# Patient Record
Sex: Female | Born: 1985
Health system: Southern US, Community
[De-identification: ages and names within clinical notes are randomized; demographics above are authoritative.]

## PROBLEM LIST (undated history)

## (undated) DIAGNOSIS — R519 Headache, unspecified: Secondary | ICD-10-CM

## (undated) DIAGNOSIS — R51 Headache: Secondary | ICD-10-CM

## (undated) DIAGNOSIS — F419 Anxiety disorder, unspecified: Secondary | ICD-10-CM

## (undated) HISTORY — DX: Anxiety disorder, unspecified: F41.9

## (undated) HISTORY — PX: KNEE SURGERY: SHX244

## (undated) HISTORY — DX: Headache: R51

## (undated) HISTORY — DX: Headache, unspecified: R51.9

---

## 2017-09-21 ENCOUNTER — Other Ambulatory Visit: Payer: Self-pay | Admitting: Physician Assistant

## 2017-09-21 DIAGNOSIS — N63 Unspecified lump in unspecified breast: Secondary | ICD-10-CM

## 2017-09-28 ENCOUNTER — Ambulatory Visit
Admission: RE | Admit: 2017-09-28 | Discharge: 2017-09-28 | Disposition: A | Discharge status: Home / Self Care | Payer: BLUE CROSS/BLUE SHIELD | Source: Ambulatory Visit | Attending: Physician Assistant | Admitting: Physician Assistant

## 2017-09-28 ENCOUNTER — Other Ambulatory Visit: Payer: Self-pay | Admitting: Physician Assistant

## 2017-09-28 DIAGNOSIS — N6489 Other specified disorders of breast: Secondary | ICD-10-CM

## 2017-09-28 DIAGNOSIS — N63 Unspecified lump in unspecified breast: Secondary | ICD-10-CM

## 2018-02-24 ENCOUNTER — Other Ambulatory Visit: Payer: Self-pay

## 2018-02-24 ENCOUNTER — Encounter: Payer: Self-pay | Admitting: *Deleted

## 2018-02-24 ENCOUNTER — Encounter: Payer: Self-pay | Admitting: Advanced Practice Midwife

## 2018-02-24 ENCOUNTER — Ambulatory Visit (INDEPENDENT_AMBULATORY_CARE_PROVIDER_SITE_OTHER): Payer: 59 | Admitting: Advanced Practice Midwife

## 2018-02-24 VITALS — BP 109/73 | HR 108 | Temp 98.0°F | Ht 62.0 in | Wt 120.0 lb

## 2018-02-24 DIAGNOSIS — F40298 Other specified phobia: Secondary | ICD-10-CM

## 2018-02-24 DIAGNOSIS — N6011 Diffuse cystic mastopathy of right breast: Secondary | ICD-10-CM

## 2018-02-24 DIAGNOSIS — N6012 Diffuse cystic mastopathy of left breast: Secondary | ICD-10-CM

## 2018-02-24 DIAGNOSIS — Z3491 Encounter for supervision of normal pregnancy, unspecified, first trimester: Secondary | ICD-10-CM

## 2018-02-24 DIAGNOSIS — O219 Vomiting of pregnancy, unspecified: Secondary | ICD-10-CM

## 2018-02-24 DIAGNOSIS — O34219 Maternal care for unspecified type scar from previous cesarean delivery: Secondary | ICD-10-CM

## 2018-02-24 DIAGNOSIS — Z3490 Encounter for supervision of normal pregnancy, unspecified, unspecified trimester: Secondary | ICD-10-CM | POA: Insufficient documentation

## 2018-02-24 MED ORDER — ONDANSETRON HCL 4 MG PO TABS: 4.0000 mg | ORAL_TABLET | Freq: Three times a day (TID) | ORAL | 3 refills | Status: DC | PRN

## 2018-02-24 NOTE — Patient Instructions (Addendum)
First Trimester of Pregnancy The first trimester of pregnancy is from week 1 until the end of week 13 (months 1 through 3). A week after a sperm fertilizes an egg, the egg will implant on the wall of the uterus. This embryo will begin to develop into a baby. Genes from you and your partner will form the baby. The female genes will determine whether the baby will be a boy or a girl. At 6-8 weeks, the eyes and face will be formed, and the heartbeat can be seen on ultrasound. At the end of 12 weeks, all the baby's organs will be formed. Now that you are pregnant, you will want to do everything you can to have a healthy baby. Two of the most important things are to get good prenatal care and to follow your health care provider's instructions. Prenatal care is all the medical care you receive before the baby's birth. This care will help prevent, find, and treat any problems during the pregnancy and childbirth. Body changes during your first trimester Your body goes through many changes during pregnancy. The changes vary from woman to woman.  You may gain or lose a couple of pounds at first.  You may feel sick to your stomach (nauseous) and you may throw up (vomit). If the vomiting is uncontrollable, call your health care provider.  You may tire easily.  You may develop headaches that can be relieved by medicines. All medicines should be approved by your health care provider.  You may urinate more often. Painful urination may mean you have a bladder infection.  You may develop heartburn as a result of your pregnancy.  You may develop constipation because certain hormones are causing the muscles that push stool through your intestines to slow down.  You may develop hemorrhoids or swollen veins (varicose veins).  Your breasts may begin to grow larger and become tender. Your nipples may stick out more, and the tissue that surrounds them (areola) may become darker.  Your gums may bleed and may be  sensitive to brushing and flossing.  Dark spots or blotches (chloasma, mask of pregnancy) may develop on your face. This will likely fade after the baby is born.  Your menstrual periods will stop.  You may have a loss of appetite.  You may develop cravings for certain kinds of food.  You may have changes in your emotions from day to day, such as being excited to be pregnant or being concerned that something may go wrong with the pregnancy and baby.  You may have more vivid and strange dreams.  You may have changes in your hair. These can include thickening of your hair, rapid growth, and changes in texture. Some women also have hair loss during or after pregnancy, or hair that feels dry or thin. Your hair will most likely return to normal after your baby is born.  What to expect at prenatal visits During a routine prenatal visit:  You will be weighed to make sure you and the baby are growing normally.  Your blood pressure will be taken.  Your abdomen will be measured to track your baby's growth.  The fetal heartbeat will be listened to between weeks 10 and 14 of your pregnancy.  Test results from any previous visits will be discussed.  Your health care provider may ask you:  How you are feeling.  If you are feeling the baby move.  If you have had any abnormal symptoms, such as leaking fluid, bleeding, severe headaches,   or abdominal cramping.  If you are using any tobacco products, including cigarettes, chewing tobacco, and electronic cigarettes.  If you have any questions.  Other tests that may be performed during your first trimester include:  Blood tests to find your blood type and to check for the presence of any previous infections. The tests will also be used to check for low iron levels (anemia) and protein on red blood cells (Rh antibodies). Depending on your risk factors, or if you previously had diabetes during pregnancy, you may have tests to check for high blood  sugar that affects pregnant women (gestational diabetes).  Urine tests to check for infections, diabetes, or protein in the urine.  An ultrasound to confirm the proper growth and development of the baby.  Fetal screens for spinal cord problems (spina bifida) and Down syndrome.  HIV (human immunodeficiency virus) testing. Routine prenatal testing includes screening for HIV, unless you choose not to have this test.  You may need other tests to make sure you and the baby are doing well.  Follow these instructions at home: Medicines  Follow your health care provider's instructions regarding medicine use. Specific medicines may be either safe or unsafe to take during pregnancy.  Take a prenatal vitamin that contains at least 600 micrograms (mcg) of folic acid.  If you develop constipation, try taking a stool softener if your health care provider approves. Eating and drinking  Eat a balanced diet that includes fresh fruits and vegetables, whole grains, good sources of protein such as meat, eggs, or tofu, and low-fat dairy. Your health care provider will help you determine the amount of weight gain that is right for you.  Avoid raw meat and uncooked cheese. These carry germs that can cause birth defects in the baby.  Eating four or five small meals rather than three large meals a day may help relieve nausea and vomiting. If you start to feel nauseous, eating a few soda crackers can be helpful. Drinking liquids between meals, instead of during meals, also seems to help ease nausea and vomiting.  Limit foods that are high in fat and processed sugars, such as fried and sweet foods.  To prevent constipation: ? Eat foods that are high in fiber, such as fresh fruits and vegetables, whole grains, and beans. ? Drink enough fluid to keep your urine clear or pale yellow. Activity  Exercise only as directed by your health care provider. Most women can continue their usual exercise routine during  pregnancy. Try to exercise for 30 minutes at least 5 days a week. Exercising will help you: ? Control your weight. ? Stay in shape. ? Be prepared for labor and delivery.  Experiencing pain or cramping in the lower abdomen or lower back is a good sign that you should stop exercising. Check with your health care provider before continuing with normal exercises.  Try to avoid standing for long periods of time. Move your legs often if you must stand in one place for a long time.  Avoid heavy lifting.  Wear low-heeled shoes and practice good posture.  You may continue to have sex unless your health care provider tells you not to. Relieving pain and discomfort  Wear a good support bra to relieve breast tenderness.  Take warm sitz baths to soothe any pain or discomfort caused by hemorrhoids. Use hemorrhoid cream if your health care provider approves.  Rest with your legs elevated if you have leg cramps or low back pain.  If you develop   varicose veins in your legs, wear support hose. Elevate your feet for 15 minutes, 3-4 times a day. Limit salt in your diet. Prenatal care  Schedule your prenatal visits by the twelfth week of pregnancy. They are usually scheduled monthly at first, then more often in the last 2 months before delivery.  Write down your questions. Take them to your prenatal visits.  Keep all your prenatal visits as told by your health care provider. This is important. Safety  Wear your seat belt at all times when driving.  Make a list of emergency phone numbers, including numbers for family, friends, the hospital, and police and fire departments. General instructions  Ask your health care provider for a referral to a local prenatal education class. Begin classes no later than the beginning of month 6 of your pregnancy.  Ask for help if you have counseling or nutritional needs during pregnancy. Your health care provider can offer advice or refer you to specialists for help  with various needs.  Do not use hot tubs, steam rooms, or saunas.  Do not douche or use tampons or scented sanitary pads.  Do not cross your legs for long periods of time.  Avoid cat litter boxes and soil used by cats. These carry germs that can cause birth defects in the baby and possibly loss of the fetus by miscarriage or stillbirth.  Avoid all smoking, herbs, alcohol, and medicines not prescribed by your health care provider. Chemicals in these products affect the formation and growth of the baby.  Do not use any products that contain nicotine or tobacco, such as cigarettes and e-cigarettes. If you need help quitting, ask your health care provider. You may receive counseling support and other resources to help you quit.  Schedule a dentist appointment. At home, brush your teeth with a soft toothbrush and be gentle when you floss. Contact a health care provider if:  You have dizziness.  You have mild pelvic cramps, pelvic pressure, or nagging pain in the abdominal area.  You have persistent nausea, vomiting, or diarrhea.  You have a bad smelling vaginal discharge.  You have pain when you urinate.  You notice increased swelling in your face, hands, legs, or ankles.  You are exposed to fifth disease or chickenpox.  You are exposed to German measles (rubella) and have never had it. Get help right away if:  You have a fever.  You are leaking fluid from your vagina.  You have spotting or bleeding from your vagina.  You have severe abdominal cramping or pain.  You have rapid weight gain or loss.  You vomit blood or material that looks like coffee grounds.  You develop a severe headache.  You have shortness of breath.  You have any kind of trauma, such as from a fall or a car accident. Summary  The first trimester of pregnancy is from week 1 until the end of week 13 (months 1 through 3).  Your body goes through many changes during pregnancy. The changes vary from  woman to woman.  You will have routine prenatal visits. During those visits, your health care provider will examine you, discuss any test results you may have, and talk with you about how you are feeling. This information is not intended to replace advice given to you by your health care provider. Make sure you discuss any questions you have with your health care provider. Document Released: 10/05/2001 Document Revised: 09/22/2016 Document Reviewed: 09/22/2016 Elsevier Interactive Patient Education  2018 [employer]  

## 2018-02-24 NOTE — Progress Notes (Signed)
NEW OB

## 2018-02-24 NOTE — Progress Notes (Addendum)
PRENATAL VISIT NOTE  Subjective:  Shannon Delgado is a 32 y.o. G2P1001 at [redacted]w[redacted]d being seen today for initial prenatal visit.  She is currently monitored for the following issues for this low-risk pregnancy and has Supervision of normal pregnancy, antepartum; History of cesarean delivery, currently pregnant; and Fear of examinations on their problem list.  Patient reports nausea and vomiting.  Contractions: Not present. Vag. Bleeding: None.   . Denies leaking of fluid.   The following portions of the patient's history were reviewed and updated as appropriate: allergies, current medications, past family history, past medical history, past social history, past surgical history and problem list. Problem list updated.  Objective:   Vitals:   02/24/18 1042 02/24/18 1044  BP: 109/73   Pulse: (!) 108   Temp: 98 F (36.7 C)   Weight: 120 lb (54.4 kg)   Height:   (1.575 m)    Fetal Status: Fetal Heart Rate (bpm): 156         VS reviewed, nursing note reviewed,  Constitutional: well developed, well nourished, no distress HEENT: normocephalic CV: normal rate Pulm/chest wall: normal effort Breast Exam:  right breast normal with multiple small, smooth, soft, mobile masses throughout, no skin or nipple changes or axillary nodes, left breast normal with multiple small, smooth, soft, mobile masses throughout, no skin or nipple changes or axillary nodes Abdomen: soft Neuro: alert and oriented x 3 Skin: warm, dry Psych: affect normal Pelvic exam: Attempted but unsuccessful due to pt anxiety/pain  Assessment and Plan:  Pregnancy: G2P1001 at [redacted]w[redacted]d  1. Encounter for supervision of normal pregnancy, antepartum, unspecified gravidity  - Enroll Patient in Babyscripts - Obstetric Panel, Including HIV - Culture, OB Urine - Hemoglobinopathy evaluation - Cystic Fibrosis Mutation 97  2. History of cesarean delivery, currently pregnant --Discussed options for VBAC vs RLTCS, pt undecided but  likely prefers repeat C/S.  See note below regarding pt anxiety about exams.  3. Fear of examinations --Attempt to perform pelvic exam and Pap today unsuccessful, despite attempts at relaxation prior to and during exam.  Pt with hx of difficulty with exams and also some difficulty with intercourse intermittently. No evidence of generalized anxiety. Pt denies sexual abuse current or hx. --Discussed options of giving anxiety medication prior to exam, having someone drive her to and from appt, and trying again to obtain Pap during pregnancy or at [facility] visit.  4. Nausea and vomiting during pregnancy prior to [redacted] weeks gestation --Pt already taking B-6 and Unisom which does manage symptoms but she has missed a lot of work related to nausea.  Add Zofran for pt to use on work days. - pyridOXINE (VITAMIN B-6) 100 MG tablet; Take by mouth. - doxylamine, Sleep, (UNISOM) 25 MG tablet; Take by mouth. - ondansetron (ZOFRAN) 4 MG tablet; Take 1 tablet (4 mg total) by mouth every 8 (eight) hours as needed for nausea or vomiting.  Dispense: 20 tablet; Refill: 3  5. Fibrocystic breasts --Bilateral, hx of cysts, had mammogram 6 months ago for same, benign but repeat mammogram of right breast for f/u scheduled 03/30/18.   Preterm labor symptoms and general obstetric precautions including but not limited to vaginal bleeding, contractions, leaking of fluid and fetal movement were reviewed in detail with the patient. Please refer to After Visit Summary for other counseling recommendations.  Return in about 1 month (around 03/24/2018).  Future Appointments  Date Time Provider Department Center  03/30/2018  9:50 AM [facility]  Fatima Blank, CNM

## 2018-02-28 LAB — OBSTETRIC PANEL, INCLUDING HIV
ANTIBODY SCREEN: NEGATIVE
BASOS: 0 %
Basophils Absolute: 0 10*3/uL (ref 0.0–0.2)
EOS (ABSOLUTE): 0.1 10*3/uL (ref 0.0–0.4)
EOS: 1 %
HEMATOCRIT: 37.9 % (ref 34.0–46.6)
HEMOGLOBIN: 12 g/dL (ref 11.1–15.9)
HIV Screen 4th Generation wRfx: NONREACTIVE
Hepatitis B Surface Ag: NEGATIVE
IMMATURE GRANS (ABS): 0 10*3/uL (ref 0.0–0.1)
IMMATURE GRANULOCYTES: 0 %
LYMPHS: 9 %
Lymphocytes Absolute: 1.2 10*3/uL (ref 0.7–3.1)
MCH: 23.7 pg — AB (ref 26.6–33.0)
MCHC: 31.7 g/dL (ref 31.5–35.7)
MCV: 75 fL — AB (ref 79–97)
MONOS ABS: 0.5 10*3/uL (ref 0.1–0.9)
Monocytes: 3 %
Neutrophils Absolute: 12.1 10*3/uL — ABNORMAL HIGH (ref 1.4–7.0)
Neutrophils: 87 %
Platelets: 278 10*3/uL (ref 150–379)
RBC: 5.06 x10E6/uL (ref 3.77–5.28)
RDW: 17.1 % — AB (ref 12.3–15.4)
RPR Ser Ql: NONREACTIVE
RUBELLA: 2.16 {index} (ref >0.99)
Rh Factor: POSITIVE
WBC: 13.9 10*3/uL — ABNORMAL HIGH (ref 3.4–10.8)

## 2018-02-28 LAB — HEMOGLOBINOPATHY EVALUATION
HEMOGLOBIN F QUANTITATION: 1 % (ref 0.0–2.0)
HGB C: 0 %
HGB S: 0 %
HGB VARIANT: 0 %
Hemoglobin A2 Quantitation: 1.8 % (ref 1.8–3.2)
Hgb A: 97.2 % (ref 96.4–98.8)

## 2018-02-28 LAB — URINE CULTURE, OB REFLEX

## 2018-02-28 LAB — CULTURE, OB URINE

## 2018-03-01 ENCOUNTER — Telehealth: Payer: Self-pay

## 2018-03-01 ENCOUNTER — Other Ambulatory Visit: Payer: Self-pay | Admitting: Advanced Practice Midwife

## 2018-03-01 MED ORDER — AMOXICILLIN 500 MG PO CAPS: 500.0000 mg | ORAL_CAPSULE | Freq: Three times a day (TID) | ORAL | 0 refills | Status: DC

## 2018-03-01 NOTE — Telephone Encounter (Signed)
Returned call and answered pt questions about GBS

## 2018-03-01 NOTE — Telephone Encounter (Signed)
Contacted pt and advised of results and rx sent by provider. Pt requested that provider send rx for diclegis, advised that I would inform the provider.

## 2018-03-01 NOTE — Progress Notes (Signed)
Pt with positive urine culture for GBS at initial OB. Amoxicillin 500 mg TID sent to pt pharmacy. Message for [facility] staff to notify pt.

## 2018-03-02 LAB — CYSTIC FIBROSIS MUTATION 97: Interpretation: NOT DETECTED

## 2018-03-23 ENCOUNTER — Encounter: Payer: Self-pay | Admitting: Obstetrics and Gynecology

## 2018-03-23 ENCOUNTER — Ambulatory Visit (INDEPENDENT_AMBULATORY_CARE_PROVIDER_SITE_OTHER): Payer: 59 | Admitting: Obstetrics and Gynecology

## 2018-03-23 ENCOUNTER — Other Ambulatory Visit: Payer: Self-pay

## 2018-03-23 VITALS — BP 101/68 | HR 103 | Wt 131.7 lb

## 2018-03-23 DIAGNOSIS — Z3490 Encounter for supervision of normal pregnancy, unspecified, unspecified trimester: Secondary | ICD-10-CM

## 2018-03-23 DIAGNOSIS — O34219 Maternal care for unspecified type scar from previous cesarean delivery: Secondary | ICD-10-CM

## 2018-03-23 MED ORDER — VITAFOL-NANO 18-0.6-0.4 MG PO TABS: 1.0000 | ORAL_TABLET | Freq: Every day | ORAL | 11 refills | Status: DC

## 2018-03-23 NOTE — Progress Notes (Signed)
ROB. C/o NV. Wants  RX for Mini PNV.

## 2018-03-23 NOTE — Patient Instructions (Signed)

## 2018-03-23 NOTE — Progress Notes (Signed)
Subjective:  Shannon Delgado is a 32 y.o. G2P1001 at [redacted]w[redacted]d being seen today for ongoing prenatal care.  She is currently monitored for the following issues for this low-risk pregnancy and has Supervision of normal pregnancy, antepartum; History of cesarean delivery, currently pregnant; and Fear of examinations on their problem list.  Patient reports nausea.  Contractions: Not present. Vag. Bleeding: None.   . Denies leaking of fluid.   The following portions of the patient's history were reviewed and updated as appropriate: allergies, current medications, past family history, past medical history, past social history, past surgical history and problem list. Problem list updated.  Objective:   Vitals:   03/23/18 1515  BP: 101/68  Pulse: (!) 103  Weight: 131 lb 11.2 oz (59.7 kg)    Fetal Status: Fetal Heart Rate (bpm): 142         General:  Alert, oriented and cooperative. Patient is in no acute distress.  Skin: Skin is warm and dry. No rash noted.   Cardiovascular: Normal heart rate noted  Respiratory: Normal respiratory effort, no problems with respiration noted  Abdomen: Soft, gravid, appropriate for gestational age. Pain/Pressure: Absent     Pelvic:  Cervical exam deferred        Extremities: Normal range of motion.  Edema: None  Mental Status: Normal mood and affect. Normal behavior. Normal judgment and thought content.   Urinalysis:      Assessment and Plan:  Pregnancy: G2P1001 at [redacted]w[redacted]d  1. Encounter for supervision of normal pregnancy, antepartum, unspecified gravidity Stable Nausea improved with eating small frequent meals Wt gain reviewed Diet modifications discussed  - Prenatal-Fe Fum-Methf-FA w/o A (VITAFOL-NANO) 18-0.6-0.4 MG TABS; Take 1 tablet by mouth daily.  Dispense: 30 tablet; Refill: 11 - [place] [facility] OB COMP + 14 WK; Future  2. History of cesarean delivery, currently pregnant Will need to discuss at later appts  Preterm labor symptoms and general obstetric  precautions including but not limited to vaginal bleeding, contractions, leaking of fluid and fetal movement were reviewed in detail with the patient. Please refer to After Visit Summary for other counseling recommendations.  Return in about 1 month (around 04/20/2018) for OB visit.   Hermina Staggers, MD

## 2018-04-05 ENCOUNTER — Telehealth: Payer: Self-pay | Admitting: *Deleted

## 2018-04-05 NOTE — Telephone Encounter (Signed)
Shannon Delgado, [name], RN  [facility]        Received notice from pharmacy PNV is not covered by insurance     Attempt to contact pt re:Rx for PNV. No answer, VM full.  Pt may choose to purchase OTC prenatal until she gets Medicaid coverage.

## 2018-04-14 ENCOUNTER — Encounter (HOSPITAL_COMMUNITY): Payer: Self-pay

## 2018-04-19 ENCOUNTER — Encounter: Payer: Self-pay | Admitting: Obstetrics and Gynecology

## 2018-04-19 ENCOUNTER — Ambulatory Visit (INDEPENDENT_AMBULATORY_CARE_PROVIDER_SITE_OTHER): Payer: 59 | Admitting: Obstetrics and Gynecology

## 2018-04-19 VITALS — BP 110/60 | HR 95 | Wt 131.0 lb

## 2018-04-19 DIAGNOSIS — O34219 Maternal care for unspecified type scar from previous cesarean delivery: Secondary | ICD-10-CM

## 2018-04-19 DIAGNOSIS — Z3490 Encounter for supervision of normal pregnancy, unspecified, unspecified trimester: Secondary | ICD-10-CM

## 2018-04-19 NOTE — Patient Instructions (Signed)

## 2018-04-19 NOTE — Progress Notes (Signed)
U/S Scheduled on 04/24/2018  Pt has concerns about how she is measuring if (S>D)

## 2018-04-19 NOTE — Progress Notes (Signed)
Subjective:  Shannon Delgado is a 32 y.o. G2P1001 at 4052w1d being seen today for ongoing prenatal care.  She is currently monitored for the following issues for this low-risk pregnancy and has Supervision of normal pregnancy, antepartum; History of cesarean delivery, currently pregnant; and Fear of examinations on their problem list.  Patient reports no complaints.  Contractions: Not present. Vag. Bleeding: None.  Movement: Present. Denies leaking of fluid.   The following portions of the patient's history were reviewed and updated as appropriate: allergies, current medications, past family history, past medical history, past social history, past surgical history and problem list. Problem list updated.  Objective:   Vitals:   04/19/18 1428  BP: 110/60  Pulse: 95  Weight: 131 lb (59.4 kg)    Fetal Status: Fetal Heart Rate (bpm): 138   Movement: Present     General:  Alert, oriented and cooperative. Patient is in no acute distress.  Skin: Skin is warm and dry. No rash noted.   Cardiovascular: Normal heart rate noted  Respiratory: Normal respiratory effort, no problems with respiration noted  Abdomen: Soft, gravid, appropriate for gestational age. Pain/Pressure: Absent     Pelvic:  Cervical exam deferred        Extremities: Normal range of motion.  Edema: None  Mental Status: Normal mood and affect. Normal behavior. Normal judgment and thought content.   Urinalysis:      Assessment and Plan:  Pregnancy: G2P1001 at 1952w1d  1. Encounter for supervision of normal pregnancy, antepartum, unspecified gravidity Stable Anatomy scan next week Still holding on NIPS  2. History of cesarean delivery, currently pregnant Discuss at 28-30 week appt  Preterm labor symptoms and general obstetric precautions including but not limited to vaginal bleeding, contractions, leaking of fluid and fetal movement were reviewed in detail with the patient. Please refer to After Visit Summary for other  counseling recommendations.  Return in about 1 month (around 05/17/2018) for OB visit.   Hermina StaggersErvin, Darriana Deboy L, MD

## 2018-04-24 ENCOUNTER — Ambulatory Visit (HOSPITAL_COMMUNITY)
Admission: RE | Admit: 2018-04-24 | Discharge: 2018-04-24 | Disposition: A | Discharge status: Home / Self Care | Payer: 59 | Source: Ambulatory Visit | Attending: Obstetrics and Gynecology | Admitting: Obstetrics and Gynecology

## 2018-04-24 ENCOUNTER — Other Ambulatory Visit: Payer: Self-pay | Admitting: Obstetrics and Gynecology

## 2018-04-24 DIAGNOSIS — Z3492 Encounter for supervision of normal pregnancy, unspecified, second trimester: Secondary | ICD-10-CM | POA: Insufficient documentation

## 2018-04-24 DIAGNOSIS — Z3490 Encounter for supervision of normal pregnancy, unspecified, unspecified trimester: Secondary | ICD-10-CM

## 2018-04-24 DIAGNOSIS — Z3A19 19 weeks gestation of pregnancy: Secondary | ICD-10-CM | POA: Diagnosis not present

## 2018-04-24 DIAGNOSIS — Z363 Encounter for antenatal screening for malformations: Secondary | ICD-10-CM

## 2018-04-24 DIAGNOSIS — Z3482 Encounter for supervision of other normal pregnancy, second trimester: Secondary | ICD-10-CM | POA: Diagnosis not present

## 2018-04-25 ENCOUNTER — Other Ambulatory Visit (HOSPITAL_COMMUNITY): Payer: Self-pay | Admitting: *Deleted

## 2018-04-25 DIAGNOSIS — O26872 Cervical shortening, second trimester: Secondary | ICD-10-CM

## 2018-05-08 ENCOUNTER — Ambulatory Visit (HOSPITAL_COMMUNITY)
Admission: RE | Admit: 2018-05-08 | Discharge: 2018-05-08 | Disposition: A | Discharge status: Home / Self Care | Payer: Managed Care, Other (non HMO) | Source: Ambulatory Visit | Attending: Obstetrics and Gynecology | Admitting: Obstetrics and Gynecology

## 2018-05-08 ENCOUNTER — Other Ambulatory Visit (HOSPITAL_COMMUNITY): Payer: Self-pay | Admitting: Obstetrics and Gynecology

## 2018-05-08 ENCOUNTER — Encounter (HOSPITAL_COMMUNITY): Payer: Self-pay

## 2018-05-08 ENCOUNTER — Other Ambulatory Visit (HOSPITAL_COMMUNITY): Payer: 59

## 2018-05-08 DIAGNOSIS — Z3A22 22 weeks gestation of pregnancy: Secondary | ICD-10-CM | POA: Diagnosis not present

## 2018-05-08 DIAGNOSIS — O34219 Maternal care for unspecified type scar from previous cesarean delivery: Secondary | ICD-10-CM

## 2018-05-08 DIAGNOSIS — O26872 Cervical shortening, second trimester: Secondary | ICD-10-CM

## 2018-05-08 DIAGNOSIS — Z3686 Encounter for antenatal screening for cervical length: Secondary | ICD-10-CM | POA: Diagnosis not present

## 2018-05-17 ENCOUNTER — Ambulatory Visit (INDEPENDENT_AMBULATORY_CARE_PROVIDER_SITE_OTHER): Payer: Managed Care, Other (non HMO) | Admitting: Obstetrics & Gynecology

## 2018-05-17 VITALS — BP 105/63 | HR 103 | Wt 137.5 lb

## 2018-05-17 DIAGNOSIS — Z3490 Encounter for supervision of normal pregnancy, unspecified, unspecified trimester: Secondary | ICD-10-CM

## 2018-05-17 DIAGNOSIS — O34219 Maternal care for unspecified type scar from previous cesarean delivery: Secondary | ICD-10-CM

## 2018-05-17 NOTE — Patient Instructions (Addendum)
Second Trimester of Pregnancy The second trimester is from week 13 through week 28, month 4 through 6. This is often the time in pregnancy that you feel your best. Often times, morning sickness has lessened or quit. You may have more energy, and you may get hungry more often. Your unborn baby (fetus) is growing rapidly. At the end of the sixth month, he or she is about 9 inches long and weighs about 1 pounds. You will likely feel the baby move (quickening) between 18 and 20 weeks of pregnancy. Follow these instructions at home:  Avoid all smoking, herbs, and alcohol. Avoid drugs not approved by your doctor.  Do not use any tobacco products, including cigarettes, chewing tobacco, and electronic cigarettes. If you need help quitting, ask your doctor. You may get counseling or other support to help you quit.  Only take medicine as told by your doctor. Some medicines are safe and some are not during pregnancy.  Exercise only as told by your doctor. Stop exercising if you start having cramps.  Eat regular, healthy meals.  Wear a good support bra if your breasts are tender.  Do not use hot tubs, steam rooms, or saunas.  Wear your seat belt when driving.  Avoid raw meat, uncooked cheese, and liter boxes and soil used by cats.  Take your prenatal vitamins.  Take 1500-2000 milligrams of calcium daily starting at the 20th week of pregnancy until you deliver your baby.  Try taking medicine that helps you poop (stool softener) as needed, and if your doctor approves. Eat more fiber by eating fresh fruit, vegetables, and whole grains. Drink enough fluids to keep your pee (urine) clear or pale yellow.  Take warm water baths (sitz baths) to soothe pain or discomfort caused by hemorrhoids. Use hemorrhoid cream if your doctor approves.  If you have puffy, bulging veins (varicose veins), wear support hose. Raise (elevate) your feet for 15 minutes, 3-4 times a day. Limit salt in your diet.  Avoid heavy  lifting, wear low heals, and sit up straight.  Rest with your legs raised if you have leg cramps or low back pain.  Visit your dentist if you have not gone during your pregnancy. Use a soft toothbrush to brush your teeth. Be gentle when you floss.  You can have sex (intercourse) unless your doctor tells you not to.  Go to your doctor visits. Get help if:  You feel dizzy.  You have mild cramps or pressure in your lower belly (abdomen).  You have a nagging pain in your belly area.  You continue to feel sick to your stomach (nauseous), throw up (vomit), or have watery poop (diarrhea).  You have bad smelling fluid coming from your vagina.  You have pain with peeing (urination). Get help right away if:  You have a fever.  You are leaking fluid from your vagina.  You have spotting or bleeding from your vagina.  You have severe belly cramping or pain.  You lose or gain weight rapidly.  You have trouble catching your breath and have chest pain.  You notice sudden or extreme puffiness (swelling) of your face, hands, ankles, feet, or legs.  You have not felt the baby move in over an hour.  You have severe headaches that do not go away with medicine.  You have vision changes. This information is not intended to replace advice given to you by your health care provider. Make sure you discuss any questions you have with your health care   provider. Document Released: 01/05/2010 Document Revised: 03/18/2016 Document Reviewed: 12/12/2012 Elsevier Interactive Patient Education  2017 [place] B Streptococcus Infection During Pregnancy Group B Streptococcus (GBS) is a type of bacteria (Streptococcus agalactiae) that is often found in healthy people, commonly in the rectum, vagina, and intestines. In people who are healthy and not pregnant, the bacteria rarely cause serious illness or complications. However, women who test positive for GBS during pregnancy can pass the bacteria  to their baby during childbirth, which can cause serious infection in the baby after birth. Women with GBS may also have infections during their pregnancy or immediately after childbirth, such as such as urinary tract infections (UTIs) or infections of the uterus (uterine infections). Having GBS also increases a woman's risk of complications during pregnancy, such as early (preterm) labor or delivery, miscarriage, or stillbirth. Routine testing (screening) for GBS is recommended for all pregnant women. What increases the risk? You may have a higher risk for GBS infection during pregnancy if you had one during a past pregnancy. What are the signs or symptoms? In most cases, GBS infection does not cause symptoms in pregnant women. Signs and symptoms of a possible GBS-related infection may include:  Labor starting before the 37th week of pregnancy.  A UTI or bladder infection, which may cause: ? Fever. ? Pain or burning during urination. ? Frequent urination.  Fever during labor, along with: ? Bad-smelling discharge. ? Uterine tenderness. ? Rapid heartbeat in the mother, baby, or both.  Rare but serious symptoms of a possible GBS-related infection in women include:  Blood infection (septicemia). This may cause fever, chills, or confusion.  Lung infection (pneumonia). This may cause fever, chills, cough, rapid breathing, difficulty breathing, or chest pain.  Bone, joint, skin, or soft tissue infection.  How is this diagnosed? You may be screened for GBS between week 35 and week 37 of your pregnancy. If you have symptoms of preterm labor, you may be screened earlier. This condition is diagnosed based on lab test results from:  A swab of fluid from the vagina and rectum.  A urine sample.  How is this treated? This condition is treated with antibiotic medicine. When you go into labor, or as soon as your water breaks (your membranes rupture), you will be given antibiotics through an IV  tube. Antibiotics will continue until after you give birth. If you are having a cesarean delivery, you do not need antibiotics unless your membranes have already ruptured. Follow these instructions at home:  Take over-the-counter and prescription medicines only as told by your health care provider.  Take your antibiotic medicine as told by your health care provider. Do not stop taking the antibiotic even if you start to feel better.  Keep all pre-birth (prenatal) visits and follow-up visits as told by your health care provider. This is important. Contact a health care provider if:  You have pain or burning when you urinate.  You have to urinate frequently.  You have a fever or chills.  You develop a bad-smelling vaginal discharge. Get help right away if:  Your membranes rupture.  You go into labor.  You have severe pain in your abdomen.  You have difficulty breathing.  You have chest pain. This information is not intended to replace advice given to you by your health care provider. Make sure you discuss any questions you have with your health care provider. Document Released: 01/18/2008 Document Revised: 05/07/2016 Document Reviewed: 05/06/2016 Elsevier Interactive Patient Education  2018 [employer]  Inc[employer]  

## 2018-05-17 NOTE — Progress Notes (Signed)
   PRENATAL VISIT NOTE  Subjective:  Shannon Delgado is a 32 y.o. G2P1001 at 6043w1d being seen today for ongoing prenatal care.  She is currently monitored for the following issues for this low-risk pregnancy and has Supervision of normal pregnancy, antepartum; History of cesarean delivery, currently pregnant; and Fear of examinations on their problem list.  Patient reports no complaints.  Contractions: Not present. Vag. Bleeding: None.  Movement: Present. Denies leaking of fluid.   The following portions of the patient's history were reviewed and updated as appropriate: allergies, current medications, past family history, past medical history, past social history, past surgical history and problem list. Problem list updated.  Objective:   Vitals:   05/17/18 1325  BP: 105/63  Pulse: (!) 103  Weight: 137 lb 8 oz (62.4 kg)    Fetal Status: Fetal Heart Rate (bpm): 146   Movement: Present     General:  Alert, oriented and cooperative. Patient is in no acute distress.  Skin: Skin is warm and dry. No rash noted.   Cardiovascular: Normal heart rate noted  Respiratory: Normal respiratory effort, no problems with respiration noted  Abdomen: Soft, gravid, appropriate for gestational age.  Pain/Pressure: Present     Pelvic: Cervical exam deferred        Extremities: Normal range of motion.  Edema: None  Mental Status: Normal mood and affect. Normal behavior. Normal judgment and thought content.   Assessment and Plan:  Pregnancy: G2P1001 at 6043w1d  1. Encounter for supervision of normal pregnancy, antepartum, unspecified gravidity Routine f/u  2. History of cesarean delivery, currently pregnant Discussed RCS vs TOLAC. She [place] in [place]  Preterm labor symptoms and general obstetric precautions including but not limited to vaginal bleeding, contractions, leaking of fluid and fetal movement were reviewed in detail with the patient. Please refer to After Visit Summary for  other counseling recommendations.  Return in about 1 month (around 06/14/2018) for 2 hr gtt.  No future appointments.  Scheryl DarterJames Donnalyn Juran, MD

## 2018-06-16 ENCOUNTER — Encounter: Payer: Managed Care, Other (non HMO) | Admitting: Obstetrics

## 2018-06-16 ENCOUNTER — Other Ambulatory Visit: Payer: Managed Care, Other (non HMO)

## 2018-06-23 ENCOUNTER — Ambulatory Visit (INDEPENDENT_AMBULATORY_CARE_PROVIDER_SITE_OTHER): Payer: Managed Care, Other (non HMO) | Admitting: Obstetrics

## 2018-06-23 ENCOUNTER — Encounter: Payer: Self-pay | Admitting: Obstetrics

## 2018-06-23 ENCOUNTER — Other Ambulatory Visit: Payer: Managed Care, Other (non HMO)

## 2018-06-23 ENCOUNTER — Encounter: Payer: Managed Care, Other (non HMO) | Admitting: Obstetrics

## 2018-06-23 VITALS — BP 112/62 | HR 114 | Wt 141.6 lb

## 2018-06-23 DIAGNOSIS — O2340 Unspecified infection of urinary tract in pregnancy, unspecified trimester: Secondary | ICD-10-CM

## 2018-06-23 DIAGNOSIS — O34219 Maternal care for unspecified type scar from previous cesarean delivery: Secondary | ICD-10-CM

## 2018-06-23 DIAGNOSIS — Z3490 Encounter for supervision of normal pregnancy, unspecified, unspecified trimester: Secondary | ICD-10-CM

## 2018-06-23 DIAGNOSIS — B955 Unspecified streptococcus as the cause of diseases classified elsewhere: Secondary | ICD-10-CM

## 2018-06-23 NOTE — Progress Notes (Signed)
Patient reports good fetal movement, denies pain. 

## 2018-06-23 NOTE — Progress Notes (Signed)
Subjective:  Shannon Delgado is a 32 y.o. G2P1001 at 161w3d being seen today for ongoing prenatal care.  She is currently monitored for the following issues for this low-risk pregnancy and has Supervision of normal pregnancy, antepartum; History of cesarean delivery, currently pregnant; and Fear of examinations on their problem list.  Patient reports no complaints.  Contractions: Not present. Vag. Bleeding: None.  Movement: Present. Denies leaking of fluid.   The following portions of the patient's history were reviewed and updated as appropriate: allergies, current medications, past family history, past medical history, past social history, past surgical history and problem list. Problem list updated.  Objective:   Vitals:   06/23/18 0908  BP: 112/62  Pulse: (!) 114  Weight: 141 lb 9.6 oz (64.2 kg)    Fetal Status: Fetal Heart Rate (bpm): 140   Movement: Present     General:  Alert, oriented and cooperative. Patient is in no acute distress.  Skin: Skin is warm and dry. No rash noted.   Cardiovascular: Normal heart rate noted  Respiratory: Normal respiratory effort, no problems with respiration noted  Abdomen: Soft, gravid, appropriate for gestational age. Pain/Pressure: Absent     Pelvic:  Cervical exam deferred        Extremities: Normal range of motion.  Edema: None  Mental Status: Normal mood and affect. Normal behavior. Normal judgment and thought content.   Urinalysis:      Assessment and Plan:  Pregnancy: G2P1001 at 591w3d  1. Encounter for supervision of normal pregnancy, antepartum, unspecified gravidity Rx: - Glucose Tolerance, 2 Hours w/1 Hour - HIV antibody (with reflex) - RPR - CBC  Preterm labor symptoms and general obstetric precautions including but not limited to vaginal bleeding, contractions, leaking of fluid and fetal movement were reviewed in detail with the patient. Please refer to After Visit Summary for other counseling recommendations.  Return in about 2  weeks (around 07/07/2018) for ROB.   Brock BadHarper, Anaisabel Pederson A, MD

## 2018-06-24 ENCOUNTER — Other Ambulatory Visit: Payer: Self-pay | Admitting: Obstetrics

## 2018-06-24 DIAGNOSIS — D508 Other iron deficiency anemias: Secondary | ICD-10-CM

## 2018-06-24 LAB — GLUCOSE TOLERANCE, 2 HOURS W/ 1HR
GLUCOSE, 2 HOUR: 115 mg/dL (ref 65–152)
GLUCOSE, FASTING: 82 mg/dL (ref 65–91)
Glucose, 1 hour: 148 mg/dL (ref 65–179)

## 2018-06-24 LAB — HIV ANTIBODY (ROUTINE TESTING W REFLEX): HIV SCREEN 4TH GENERATION: NONREACTIVE

## 2018-06-24 LAB — CBC
Hematocrit: 31.3 % — ABNORMAL LOW (ref 34.0–46.6)
Hemoglobin: 9.6 g/dL — ABNORMAL LOW (ref 11.1–15.9)
MCH: 22.9 pg — AB (ref 26.6–33.0)
MCHC: 30.7 g/dL — AB (ref 31.5–35.7)
MCV: 75 fL — ABNORMAL LOW (ref 79–97)
PLATELETS: 252 10*3/uL (ref 150–450)
RBC: 4.2 x10E6/uL (ref 3.77–5.28)
RDW: 14.3 % (ref 12.3–15.4)
WBC: 13.4 10*3/uL — ABNORMAL HIGH (ref 3.4–10.8)

## 2018-06-24 LAB — RPR: RPR Ser Ql: NONREACTIVE

## 2018-06-24 MED ORDER — FERROUS SULFATE 325 (65 FE) MG PO TABS: 325.0000 mg | ORAL_TABLET | Freq: Two times a day (BID) | ORAL | 5 refills | Status: DC

## 2018-07-07 ENCOUNTER — Ambulatory Visit (INDEPENDENT_AMBULATORY_CARE_PROVIDER_SITE_OTHER): Payer: Managed Care, Other (non HMO) | Admitting: Obstetrics

## 2018-07-07 ENCOUNTER — Encounter: Payer: Self-pay | Admitting: Obstetrics

## 2018-07-07 VITALS — BP 100/66 | HR 103 | Wt 144.0 lb

## 2018-07-07 DIAGNOSIS — O34219 Maternal care for unspecified type scar from previous cesarean delivery: Secondary | ICD-10-CM

## 2018-07-07 DIAGNOSIS — Z3493 Encounter for supervision of normal pregnancy, unspecified, third trimester: Secondary | ICD-10-CM

## 2018-07-07 DIAGNOSIS — Z3490 Encounter for supervision of normal pregnancy, unspecified, unspecified trimester: Secondary | ICD-10-CM

## 2018-07-07 NOTE — Progress Notes (Signed)
Subjective:  Shannon Delgado is a 32 y.o. G2P1001 at 9579w3d being seen today for ongoing prenatal care.  She is currently monitored for the following issues for this low-risk pregnancy and has Supervision of normal pregnancy, antepartum; History of cesarean delivery, currently pregnant; and Fear of examinations on their problem list.  Patient reports no complaints.  Contractions: Not present. Vag. Bleeding: None.  Movement: Present. Denies leaking of fluid.   The following portions of the patient's history were reviewed and updated as appropriate: allergies, current medications, past family history, past medical history, past social history, past surgical history and problem list. Problem list updated.  Objective:   Vitals:   07/07/18 0934  BP: 100/66  Pulse: (!) 103  Weight: 144 lb (65.3 kg)    Fetal Status:     Movement: Present     General:  Alert, oriented and cooperative. Patient is in no acute distress.  Skin: Skin is warm and dry. No rash noted.   Cardiovascular: Normal heart rate noted  Respiratory: Normal respiratory effort, no problems with respiration noted  Abdomen: Soft, gravid, appropriate for gestational age. Pain/Pressure: Absent     Pelvic:  Cervical exam deferred        Extremities: Normal range of motion.  Edema: None  Mental Status: Normal mood and affect. Normal behavior. Normal judgment and thought content.   Urinalysis:      Assessment and Plan:  Pregnancy: G2P1001 at 3379w3d  1. Encounter for supervision of normal pregnancy, antepartum, unspecified gravidity  2. History of cesarean delivery, currently pregnant   Preterm labor symptoms and general obstetric precautions including but not limited to vaginal bleeding, contractions, leaking of fluid and fetal movement were reviewed in detail with the patient. Please refer to After Visit Summary for other counseling recommendations.  Return in about 2 weeks (around 07/21/2018) for ROB.   Brock BadHarper, Charles A, MD

## 2018-07-21 ENCOUNTER — Ambulatory Visit (INDEPENDENT_AMBULATORY_CARE_PROVIDER_SITE_OTHER): Payer: Managed Care, Other (non HMO) | Admitting: Obstetrics & Gynecology

## 2018-07-21 VITALS — BP 96/59 | HR 112 | Wt 147.0 lb

## 2018-07-21 DIAGNOSIS — Z23 Encounter for immunization: Secondary | ICD-10-CM | POA: Diagnosis not present

## 2018-07-21 DIAGNOSIS — O34219 Maternal care for unspecified type scar from previous cesarean delivery: Secondary | ICD-10-CM

## 2018-07-21 DIAGNOSIS — Z3480 Encounter for supervision of other normal pregnancy, unspecified trimester: Secondary | ICD-10-CM

## 2018-08-07 ENCOUNTER — Encounter (HOSPITAL_COMMUNITY): Payer: Self-pay

## 2018-08-07 ENCOUNTER — Encounter: Payer: Self-pay | Admitting: Obstetrics & Gynecology

## 2018-08-07 ENCOUNTER — Ambulatory Visit (INDEPENDENT_AMBULATORY_CARE_PROVIDER_SITE_OTHER): Payer: Managed Care, Other (non HMO) | Admitting: Obstetrics & Gynecology

## 2018-08-07 VITALS — BP 114/69 | HR 120 | Wt 150.0 lb

## 2018-08-07 DIAGNOSIS — L299 Pruritus, unspecified: Secondary | ICD-10-CM

## 2018-08-07 DIAGNOSIS — O99713 Diseases of the skin and subcutaneous tissue complicating pregnancy, third trimester: Secondary | ICD-10-CM

## 2018-08-07 DIAGNOSIS — Z3480 Encounter for supervision of other normal pregnancy, unspecified trimester: Secondary | ICD-10-CM

## 2018-08-07 DIAGNOSIS — O34219 Maternal care for unspecified type scar from previous cesarean delivery: Secondary | ICD-10-CM

## 2018-08-07 MED ORDER — HYDROXYZINE HCL 25 MG PO TABS
25.0000 mg | ORAL_TABLET | Freq: Four times a day (QID) | ORAL | 2 refills | Status: DC | PRN
Start: 2018-08-07 — End: 2018-08-28

## 2018-08-07 NOTE — Progress Notes (Signed)
   PRENATAL VISIT NOTE  Subjective:  Shannon Delgado is a 32 y.o. G2P1001 at [redacted]w[redacted]d being seen today for ongoing prenatal care.  She is currently monitored for the following issues for this low-risk pregnancy and has Supervision of normal pregnancy, antepartum; History of cesarean delivery, currently pregnant; and Fear of examinations on their problem list.  Patient reports diffuse itching all over her body especially at night.  Nobody else has same symptoms. Recently switched detergents to free and clear preparation, unsure it this caused it. Only happens when she goes to bed. No rash.   Contractions: Irregular. Vag. Bleeding: None.  Movement: Present. Denies leaking of fluid.   The following portions of the patient's history were reviewed and updated as appropriate: allergies, current medications, past family history, past medical history, past social history, past surgical history and problem list. Problem list updated.  Objective:   Vitals:   08/07/18 1012  BP: 114/69  Pulse: (!) 120  Weight: 150 lb (68 kg)    Fetal Status: Fetal Heart Rate (bpm): 147 Fundal Height: 35 cm Movement: Present     General:  Alert, oriented and cooperative. Patient is in no acute distress.  Skin: Skin is warm and dry. No rash noted.   Cardiovascular: Normal heart rate noted  Respiratory: Normal respiratory effort, no problems with respiration noted  Abdomen: Soft, gravid, appropriate for gestational age.  Pain/Pressure: Present     Pelvic: Cervical exam deferred        Extremities: Normal range of motion.  Edema: None  Mental Status: Normal mood and affect. Normal behavior. Normal judgment and thought content.   Assessment and Plan:  Pregnancy: G2P1001 at [redacted]w[redacted]d  1. Pruritus of pregnancy in third trimester Unclear etiology but will evaluate for ICP, ?contact dermatitis.  Atarax prescribed as needed. Will follow up results and manage accordingly. - Comprehensive metabolic panel - Bile acids, total -  hydrOXYzine (ATARAX/VISTARIL) 25 MG tablet; Take 1-2 tablets (25-50 mg total) by mouth every 6 (six) hours as needed for itching.  Dispense: 30 tablet; Refill: 2  2. History of cesarean delivery, currently pregnant Desires repeat cesarean delivery, order sent for it to be scheduled at 39 weeks. Patient understands this may have to be done earlier if she has cholestasis or other concerning maternal-fetal indications.   3. Supervision of other normal pregnancy, antepartum Preterm labor symptoms and general obstetric precautions including but not limited to vaginal bleeding, contractions, leaking of fluid and fetal movement were reviewed in detail with the patient. Please refer to After Visit Summary for other counseling recommendations.  Return in about 1 week (around 08/14/2018) for OB Visit.  No future appointments.  Jaynie Collins, MD

## 2018-08-07 NOTE — Patient Instructions (Signed)
Return to clinic for any scheduled appointments or obstetric concerns, or go to [facility] for evaluation    Cholestasis of Pregnancy Cholestasis refers to any condition that causes the flow of digestive fluid (bile) produced by the liver to slow down or stop. Cholestasis of pregnancy is most common toward the end of pregnancy (thirdtrimester), but it can occur any time during pregnancy. The condition often goes away soon after giving birth. Cholestasis may be uncomfortable, but it is usually harmless to you. However, it can be harmful to your baby. Cholestasis may increase the risk of:  Your baby being born too early (preterm delivery).  Your baby having a slow heart rate and lack of oxygen during delivery (fetal distress).  Losing your baby before delivery (stillbirth).  What are the causes? The exact cause of this condition is not known, but it may be related to:  Pregnancy hormones. The gallbladder normally holds bile until you need it to help digest fat in your diet. Pregnancy hormones may cause the flow of bile to slow down and back up into your liver. Bile may then get into your bloodstream and cause cholestasis symptoms.  Changes in your genes (genetic mutations). Specifically, genes that affect how the liver releases bile.  What increases the risk? You are more likely to develop this condition if:  You had cholestasis during a previous pregnancy.  You have a family history of cholestasis.  You have liver problems.  You are having multiple babies, such as twins or triplets.  What are the signs or symptoms? The most common symptom of this condition is intense itching (pruritus), especially on the palms of your hands and soles of your feet. The itching can spread to the rest of your body and is often worse at night. You will not usually have a rash. Other symptoms may include:  Feeling tired.  Pain in your upper right abdomen.  Dark-colored urine.  Light-colored  stools.  Poor appetite.  Yellowish discoloration of your skin and the whites of your eyes (jaundice).  How is this diagnosed? This condition is diagnosed based on:  Your medical history.  A physical exam.  Blood tests.  If you have an inherited risk for developing this condition, you may also have genetic testing. How is this treated? The goal of treatment is to make you comfortable and keep your baby safe. Your health care provider may:  Prescribe medicine to reduce bile acid in your bloodstream, relieve symptoms, and help keep your baby safe.  Give you vitamin K before delivery to prevent excessive bleeding.  Check your baby frequently (fetal monitoring).  Perform regular blood tests to check your bile levels and liver function until your baby is delivered.  Recommend starting (inducing) your labor and delivery by week 36 or 37 of pregnancy, or as soon as your baby's lungs have developed enough.  Follow these instructions at home:  Take over-the-counter and prescription medicines only as told by your health care provider.  Take cool baths to soothe itchy skin.  Wear comfortable, loose-fitting, cotton clothing to reduce itching.  Keep your fingernails short to prevent skin irritation from scratching.  Keep all follow-up visits and prenatal visits as told by your health care provider. This is important. Contact a health care provider if:  Your symptoms get worse, even with treatment.  You develop pain in your right side.  You have unusual swelling in your abdomen, feet, ankles, or legs.  You have a fever.  You are more thirsty  than usual. Get help right away if:  You go into early labor.  You have a headache that does not go away or causes changes in vision.  You have nausea or you vomit.  You have severe pain in your abdomen or shoulders.  You have shortness of breath. Summary  Cholestasis of pregnancy is most common toward the end of pregnancy  (thirdtrimester), but it can occur any time during your pregnancy.  The condition often goes away soon after your baby is born.  The most common symptom of cholestasis of pregnancy is intense itching (pruritus), especially on the palms of your hands and soles of your feet.  This condition may be treated with medicine, frequent monitoring, or starting (inducing) labor and delivery by week 36 or 37 of pregnancy. This information is not intended to replace advice given to you by your health care provider. Make sure you discuss any questions you have with your health care provider. Document Released: 10/08/2000 Document Revised: 09/25/2016 Document Reviewed: 09/25/2016 Elsevier Interactive Patient Education  2017 [employer].

## 2018-08-08 LAB — COMPREHENSIVE METABOLIC PANEL
ALBUMIN: 3.7 g/dL (ref 3.5–5.5)
ALT: 11 IU/L (ref 0–32)
AST: 14 IU/L (ref 0–40)
Albumin/Globulin Ratio: 1.4 (ref 1.2–2.2)
Alkaline Phosphatase: 86 IU/L (ref 39–117)
BUN/Creatinine Ratio: 13 (ref 9–23)
BUN: 8 mg/dL (ref 6–20)
Bilirubin Total: 0.5 mg/dL (ref 0.0–1.2)
CO2: 21 mmol/L (ref 20–29)
Calcium: 9.6 mg/dL (ref 8.7–10.2)
Chloride: 103 mmol/L (ref 96–106)
Creatinine, Ser: 0.64 mg/dL (ref 0.57–1.00)
GFR, EST AFRICAN AMERICAN: 138 mL/min/{1.73_m2} (ref >59)
GFR, EST NON AFRICAN AMERICAN: 119 mL/min/{1.73_m2} (ref >59)
GLOBULIN, TOTAL: 2.6 g/dL (ref 1.5–4.5)
Glucose: 113 mg/dL — ABNORMAL HIGH (ref 65–99)
Potassium: 4.1 mmol/L (ref 3.5–5.2)
Sodium: 137 mmol/L (ref 134–144)
Total Protein: 6.3 g/dL (ref 6.0–8.5)

## 2018-08-08 LAB — BILE ACIDS, TOTAL: Bile Acids Total: 3 umol/L (ref 0.0–10.0)

## 2018-08-15 ENCOUNTER — Encounter: Payer: Self-pay | Admitting: Obstetrics

## 2018-08-15 ENCOUNTER — Ambulatory Visit (INDEPENDENT_AMBULATORY_CARE_PROVIDER_SITE_OTHER): Payer: Managed Care, Other (non HMO) | Admitting: Obstetrics

## 2018-08-15 ENCOUNTER — Other Ambulatory Visit: Payer: Self-pay

## 2018-08-15 VITALS — BP 98/58 | HR 102 | Wt 150.0 lb

## 2018-08-15 DIAGNOSIS — Z3483 Encounter for supervision of other normal pregnancy, third trimester: Secondary | ICD-10-CM

## 2018-08-15 DIAGNOSIS — O34219 Maternal care for unspecified type scar from previous cesarean delivery: Secondary | ICD-10-CM

## 2018-08-15 DIAGNOSIS — Z3490 Encounter for supervision of normal pregnancy, unspecified, unspecified trimester: Secondary | ICD-10-CM

## 2018-08-15 DIAGNOSIS — O2340 Unspecified infection of urinary tract in pregnancy, unspecified trimester: Secondary | ICD-10-CM

## 2018-08-15 DIAGNOSIS — Z113 Encounter for screening for infections with a predominantly sexual mode of transmission: Secondary | ICD-10-CM

## 2018-08-15 DIAGNOSIS — O2343 Unspecified infection of urinary tract in pregnancy, third trimester: Secondary | ICD-10-CM

## 2018-08-15 DIAGNOSIS — F40298 Other specified phobia: Secondary | ICD-10-CM

## 2018-08-15 DIAGNOSIS — B955 Unspecified streptococcus as the cause of diseases classified elsewhere: Secondary | ICD-10-CM

## 2018-08-15 DIAGNOSIS — D508 Other iron deficiency anemias: Secondary | ICD-10-CM

## 2018-08-15 MED ORDER — FUSION PLUS PO CAPS: 1.0000 | ORAL_CAPSULE | Freq: Every day | ORAL | 5 refills | Status: AC

## 2018-08-15 NOTE — Progress Notes (Signed)
ROB.  C/o back pain 4-5/10 x 2 weeks, stiffness/pain in legs 3/10 x 1 month. Patient is refusing 36 weeks testing.  Patient is Jehov[employer] Witness and DO NOT want blood Transfusion.

## 2018-08-15 NOTE — Progress Notes (Signed)
Subjective:  Shannon Delgado is a 32 y.o. G2P1001 at [redacted]w[redacted]d being seen today for ongoing prenatal care.  She is currently monitored for the following issues for this high-risk pregnancy and has Supervision of normal pregnancy, antepartum; History of cesarean delivery, currently pregnant; and Fear of examinations on their problem list.  Patient reports occasional contractions.  Contractions: Irregular. Vag. Bleeding: None.  Movement: Present. Denies leaking of fluid.   The following portions of the patient's history were reviewed and updated as appropriate: allergies, current medications, past family history, past medical history, past social history, past surgical history and problem list. Problem list updated.  Objective:   Vitals:   08/15/18 0853  BP: (!) 98/58  Pulse: (!) 102  Weight: 150 lb (68 kg)    Fetal Status:     Movement: Present     General:  Alert, oriented and cooperative. Patient is in no acute distress.  Skin: Skin is warm and dry. No rash noted.   Cardiovascular: Normal heart rate noted  Respiratory: Normal respiratory effort, no problems with respiration noted  Abdomen: Soft, gravid, appropriate for gestational age. Pain/Pressure: Present     Pelvic:  Cervical exam performed        Extremities: Normal range of motion.  Edema: None  Mental Status: Normal mood and affect. Normal behavior. Normal judgment and thought content.   Urinalysis:      Assessment and Plan:  Pregnancy: G2P1001 at [redacted]w[redacted]d  1. Encounter for supervision of normal pregnancy, antepartum, unspecified gravidity Rx: - Urine Culture - Urine cytology ancillary only([place])  2. History of cesarean delivery, currently pregnant - wants repeat C/S  3. Beta hemolytic Streptococcus UTI complicating pregnancy, antepartum - treat at C/S  4. Iron deficiency anemia secondary to inadequate dietary iron intake Rx: - CBC - Iron-FA-B Cmp-C-Biot-Probiotic (FUSION PLUS) CAPS; Take 1 capsule by mouth daily  before breakfast.  Dispense: 30 capsule; Refill: 5  5. Fear of examinations   Term labor symptoms and general obstetric precautions including but not limited to vaginal bleeding, contractions, leaking of fluid and fetal movement were reviewed in detail with the patient. Please refer to After Visit Summary for other counseling recommendations.  Return in about 1 week (around 08/22/2018) for ROB.   Brock Bad, MD

## 2018-08-16 LAB — CBC
Hematocrit: 29.5 % — ABNORMAL LOW (ref 34.0–46.6)
Hemoglobin: 9 g/dL — ABNORMAL LOW (ref 11.1–15.9)
MCH: 22.2 pg — AB (ref 26.6–33.0)
MCHC: 30.5 g/dL — AB (ref 31.5–35.7)
MCV: 73 fL — AB (ref 79–97)
PLATELETS: 271 10*3/uL (ref 150–450)
RBC: 4.05 x10E6/uL (ref 3.77–5.28)
RDW: 15.4 % (ref 12.3–15.4)
WBC: 14.1 10*3/uL — ABNORMAL HIGH (ref 3.4–10.8)

## 2018-08-16 LAB — URINE CYTOLOGY ANCILLARY ONLY
Chlamydia: NEGATIVE
Neisseria Gonorrhea: NEGATIVE

## 2018-08-17 LAB — URINE CULTURE

## 2018-08-21 ENCOUNTER — Other Ambulatory Visit: Payer: Self-pay | Admitting: Obstetrics

## 2018-08-21 DIAGNOSIS — O2343 Unspecified infection of urinary tract in pregnancy, third trimester: Principal | ICD-10-CM

## 2018-08-21 DIAGNOSIS — B955 Unspecified streptococcus as the cause of diseases classified elsewhere: Secondary | ICD-10-CM

## 2018-08-21 DIAGNOSIS — D508 Other iron deficiency anemias: Secondary | ICD-10-CM

## 2018-08-21 MED ORDER — FERROUS SULFATE 325 (65 FE) MG PO TABS: 325.0000 mg | ORAL_TABLET | Freq: Two times a day (BID) | ORAL | 5 refills | Status: DC

## 2018-08-21 MED ORDER — AMOXICILLIN-POT CLAVULANATE 875-125 MG PO TABS: 1.0000 | ORAL_TABLET | Freq: Two times a day (BID) | ORAL | 0 refills | Status: DC

## 2018-08-22 ENCOUNTER — Telehealth (HOSPITAL_COMMUNITY): Payer: Self-pay | Admitting: *Deleted

## 2018-08-22 NOTE — Telephone Encounter (Signed)
Preadmission screen  

## 2018-08-24 ENCOUNTER — Encounter (HOSPITAL_COMMUNITY): Payer: Self-pay

## 2018-08-25 ENCOUNTER — Ambulatory Visit (INDEPENDENT_AMBULATORY_CARE_PROVIDER_SITE_OTHER): Payer: Managed Care, Other (non HMO) | Admitting: Obstetrics

## 2018-08-25 ENCOUNTER — Encounter: Payer: Self-pay | Admitting: Obstetrics

## 2018-08-25 VITALS — BP 116/71 | HR 118 | Wt 151.0 lb

## 2018-08-25 DIAGNOSIS — O2340 Unspecified infection of urinary tract in pregnancy, unspecified trimester: Secondary | ICD-10-CM

## 2018-08-25 DIAGNOSIS — O2343 Unspecified infection of urinary tract in pregnancy, third trimester: Secondary | ICD-10-CM

## 2018-08-25 DIAGNOSIS — B955 Unspecified streptococcus as the cause of diseases classified elsewhere: Secondary | ICD-10-CM

## 2018-08-25 DIAGNOSIS — F40298 Other specified phobia: Secondary | ICD-10-CM

## 2018-08-25 DIAGNOSIS — O34219 Maternal care for unspecified type scar from previous cesarean delivery: Secondary | ICD-10-CM

## 2018-08-25 DIAGNOSIS — Z3480 Encounter for supervision of other normal pregnancy, unspecified trimester: Secondary | ICD-10-CM

## 2018-08-25 DIAGNOSIS — Z3483 Encounter for supervision of other normal pregnancy, third trimester: Secondary | ICD-10-CM

## 2018-08-25 NOTE — Progress Notes (Signed)
+  GBS  C-section on 09/05/18

## 2018-08-25 NOTE — Progress Notes (Signed)
Subjective:  Shannon Delgado is a 32 y.o. G2P1001 at [redacted]w[redacted]d being seen today for ongoing prenatal care.  She is currently monitored for the following issues for this low-risk pregnancy and has Supervision of normal pregnancy, antepartum; History of cesarean delivery, currently pregnant; and Fear of examinations on their problem list.  Patient reports no complaints.  Contractions: Irritability. Vag. Bleeding: None.  Movement: Present. Denies leaking of fluid.   The following portions of the patient's history were reviewed and updated as appropriate: allergies, current medications, past family history, past medical history, past social history, past surgical history and problem list. Problem list updated.  Objective:   Vitals:   08/25/18 1012  BP: 116/71  Pulse: (!) 118  Weight: 151 lb (68.5 kg)    Fetal Status: Fetal Heart Rate (bpm): 140   Movement: Present     General:  Alert, oriented and cooperative. Patient is in no acute distress.  Skin: Skin is warm and dry. No rash noted.   Cardiovascular: Normal heart rate noted  Respiratory: Normal respiratory effort, no problems with respiration noted  Abdomen: Soft, gravid, appropriate for gestational age. Pain/Pressure: Present     Pelvic:  Cervical exam deferred        Extremities: Normal range of motion.  Edema: Trace  Mental Status: Normal mood and affect. Normal behavior. Normal judgment and thought content.   Urinalysis:      Assessment and Plan:  Pregnancy: G2P1001 at [redacted]w[redacted]d  1. Supervision of other normal pregnancy, antepartum  2. History of cesarean delivery, currently pregnant - repeat C/S desired  3. Beta hemolytic Streptococcus UTI complicating pregnancy, antepartum - treated  4. Fear of examinations   Term labor symptoms and general obstetric precautions including but not limited to vaginal bleeding, contractions, leaking of fluid and fetal movement were reviewed in detail with the patient. Please refer to After Visit  Summary for other counseling recommendations.  Return in about 1 week (around 09/01/2018) for ROB.   Brock Bad, MD

## 2018-08-28 ENCOUNTER — Ambulatory Visit (INDEPENDENT_AMBULATORY_CARE_PROVIDER_SITE_OTHER): Payer: Managed Care, Other (non HMO)

## 2018-08-28 ENCOUNTER — Inpatient Hospital Stay (HOSPITAL_COMMUNITY)
Admission: AD | Admit: 2018-08-28 | Discharge: 2018-08-28 | Disposition: A | Discharge status: Home / Self Care | Payer: Managed Care, Other (non HMO) | Source: Ambulatory Visit | Attending: Obstetrics and Gynecology | Admitting: Obstetrics and Gynecology

## 2018-08-28 ENCOUNTER — Encounter (HOSPITAL_COMMUNITY): Payer: Self-pay | Admitting: *Deleted

## 2018-08-28 VITALS — BP 106/59 | HR 89 | Wt 155.0 lb

## 2018-08-28 DIAGNOSIS — M5412 Radiculopathy, cervical region: Secondary | ICD-10-CM

## 2018-08-28 DIAGNOSIS — Z3A37 37 weeks gestation of pregnancy: Secondary | ICD-10-CM | POA: Diagnosis not present

## 2018-08-28 DIAGNOSIS — M25511 Pain in right shoulder: Secondary | ICD-10-CM | POA: Insufficient documentation

## 2018-08-28 DIAGNOSIS — O26893 Other specified pregnancy related conditions, third trimester: Secondary | ICD-10-CM

## 2018-08-28 DIAGNOSIS — Z3493 Encounter for supervision of normal pregnancy, unspecified, third trimester: Secondary | ICD-10-CM

## 2018-08-28 LAB — POCT URINALYSIS DIPSTICK
Blood, UA: NEGATIVE
GLUCOSE UA: NEGATIVE
Leukocytes, UA: NEGATIVE
NITRITE UA: NEGATIVE
PROTEIN UA: POSITIVE — AB
SPEC GRAV UA: 1.015 (ref 1.010–1.025)
Urobilinogen, UA: 0.2 E.U./dL
pH, UA: 5 (ref 5.0–8.0)

## 2018-08-28 NOTE — Discharge Instructions (Signed)
Cervical Radiculopathy Cervical radiculopathy happens when a nerve in the neck (cervical nerve) is pinched or bruised. This condition can develop because of an injury or as part of the normal aging process. Pressure on the cervical nerves can cause pain or numbness that runs from the neck all the way down into the arm and fingers. Usually, this condition gets better with rest. Treatment may be needed if the condition does not improve. What are the causes? This condition may be caused by:  Injury.  Slipped (herniated) disk.  Muscle tightness in the neck because of overuse.  Arthritis.  Breakdown or degeneration in the bones and joints of the spine (spondylosis) due to aging.  Bone spurs that may develop near the cervical nerves.  What are the signs or symptoms? Symptoms of this condition include:  Pain that runs from the neck to the arm and hand. The pain can be severe or irritating. It may be worse when the neck is moved.  Numbness or weakness in the affected arm and hand.  How is this diagnosed? This condition may be diagnosed based on symptoms, medical history, and a physical exam. You may also have tests, including:  X-rays.  CT scan.  MRI.  Electromyogram (EMG).  Nerve conduction tests.  How is this treated? In many cases, treatment is not needed for this condition. With rest, the condition usually gets better over time. If treatment is needed, options may include:  Wearing a soft neck collar for short periods of time.  Physical therapy to strengthen your neck muscles.  Medicines, such as NSAIDs, oral corticosteroids, or spinal injections.  Surgery. This may be needed if other treatments do not help. Various types of surgery may be done depending on the cause of your problems.  Follow these instructions at home: Managing pain  Take over-the-counter and prescription medicines only as told by your health care provider.  If directed, apply ice to the affected  area. ? Put ice in a plastic bag. ? Place a towel between your skin and the bag. ? Leave the ice on for 20 minutes, 2-3 times per day.  If ice does not help, you can try using heat. Take a warm shower or warm bath, or use a heat pack as told by your health care provider.  Try a gentle neck and shoulder massage to help relieve symptoms. Activity  Rest as needed. Follow instructions from your health care provider about any restrictions on activities.  Do stretching and strengthening exercises as told by your health care provider or physical therapist. General instructions  If you were given a soft collar, wear it as told by your health care provider.  Use a flat pillow when you sleep.  Keep all follow-up visits as told by your health care provider. This is important. Contact a health care provider if:  Your condition does not improve with treatment. Get help right away if:  Your pain gets much worse and cannot be controlled with medicines.  You have weakness or numbness in your hand, arm, face, or leg.  You have a high fever.  You have a stiff, rigid neck.  You lose control of your bowels or your bladder (have incontinence).  You have trouble with walking, balance, or speaking. This information is not intended to replace advice given to you by your health care provider. Make sure you discuss any questions you have with your health care provider. Document Released: 07/06/2001 Document Revised: 03/18/2016 Document Reviewed: 12/05/2014 Elsevier Interactive Patient Education  2018 [employer]     Fetal Movement Counts Patient Name: ________________________________________________ Patient Due Date: ____________________ What is a fetal movement count? A fetal movement count is the number of times that you feel your baby move during a certain amount of time. This may also be called a fetal kick count. A fetal movement count is recommended for every pregnant woman. You may be  asked to start counting fetal movements as early as week 28 of your pregnancy. Pay attention to when your baby is most active. You may notice your baby's sleep and wake cycles. You may also notice things that make your baby move more. You should do a fetal movement count:  When your baby is normally most active.  At the same time each day.  A good time to count movements is while you are resting, after having something to eat and drink. How do I count fetal movements? 1. Find a quiet, comfortable area. Sit, or lie down on your side. 2. Write down the date, the start time and stop time, and the number of movements that you felt between those two times. Take this information with you to your health care visits. 3. For 2 hours, count kicks, flutters, swishes, rolls, and jabs. You should feel at least 10 movements during 2 hours. 4. You may stop counting after you have felt 10 movements. 5. If you do not feel 10 movements in 2 hours, have something to eat and drink. Then, keep resting and counting for 1 hour. If you feel at least 4 movements during that hour, you may stop counting. Contact a health care provider if:  You feel fewer than 4 movements in 2 hours.  Your baby is not moving like he or she usually does. Date: ____________ Start time: ____________ Stop time: ____________ Movements: ____________ Date: ____________ Start time: ____________ Stop time: ____________ Movements: ____________ Date: ____________ Start time: ____________ Stop time: ____________ Movements: ____________ Date: ____________ Start time: ____________ Stop time: ____________ Movements: ____________ Date: ____________ Start time: ____________ Stop time: ____________ Movements: ____________ Date: ____________ Start time: ____________ Stop time: ____________ Movements: ____________ Date: ____________ Start time: ____________ Stop time: ____________ Movements: ____________ Date: ____________ Start time: ____________ Stop  time: ____________ Movements: ____________ Date: ____________ Start time: ____________ Stop time: ____________ Movements: ____________ This information is not intended to replace advice given to you by your health care provider. Make sure you discuss any questions you have with your health care provider. Document Released: 11/10/2006 Document Revised: 06/09/2016 Document Reviewed: 11/20/2015 Elsevier Interactive Patient Education  [employer].

## 2018-08-28 NOTE — Progress Notes (Signed)
ROB presents for right shoulder pain that runs down her arm and side to back 5/10 started last night and NV x 1.  Denies fever, chills, headaches, LOF.  Patient was directed to the [facility] per Dr. Jolayne Panther.

## 2018-08-28 NOTE — Progress Notes (Signed)
I have reviewed the chart and agree with nursing staff's documentation of this patient's encounter.  Catalina Antigua, MD 08/28/2018 4:08 PM

## 2018-08-28 NOTE — MAU Note (Signed)
Urine in lab 

## 2018-08-28 NOTE — MAU Provider Note (Signed)
Chief Complaint:  Shoulder Pain   First Provider Initiated Contact with Patient 08/28/18 0955     HPI: Shannon Delgado is a 32 y.o. G2P1001 at [redacted]w[redacted]d who presents to maternity admissions reporting shoulder pain. Symptoms began last night. Reports right neck/shoulder pain that radiates down her arm. Denies injury. Reports lifting her child frequently. Denies numbness/tingling. No fall or traumatic injury.   Denies contractions, leakage of fluid or vaginal bleeding. Good fetal movement.  Location: right shoulder Quality: dull Severity: 5/10 in pain scale Duration: 1 day Timing: constant Modifying factors: worse with movement Associated signs and symptoms: none  Past Medical History:  Diagnosis Date  . Anxiety   . Headache    OB History  Gravida Para Term Preterm AB Living  2 1 1     1   SAB TAB Ectopic Multiple Live Births          1    # Outcome Date GA Lbr Len/2nd Weight Sex Delivery Anes PTL Lv  2 Current           1 Term 2015 [redacted]w[redacted]d    CS-LTranv   LIV    Obstetric Comments  Primary LTCS due to anxiety about pelvic/vaginal examination   Past Surgical History:  Procedure Laterality Date  . CESAREAN SECTION     Family History  Problem Relation Age of Onset  . Breast cancer Paternal Aunt 71  . Diabetes Paternal Aunt   . Diabetes Father   . Hypertension Father   . Anxiety disorder Sister   . Depression Sister   . Cancer Maternal Aunt   . Diabetes Paternal Grandfather   . Asthma Other    Social History   Tobacco Use  . Smoking status: Never Smoker  . Smokeless tobacco: Never Used  Substance Use Topics  . Alcohol use: Not Currently  . Drug use: Never   No Known Allergies No medications prior to admission.    I have reviewed patient's Past Medical Hx, Surgical Hx, Family Hx, Social Hx, medications and allergies.   ROS:  Review of Systems  Constitutional: Negative.   Gastrointestinal: Negative.   Genitourinary: Negative.   Musculoskeletal: Negative for joint  swelling, neck pain and neck stiffness.       + shoulder & arm pain    Physical Exam   Patient Vitals for the past 24 hrs:  BP Temp Pulse Resp  08/28/18 0926 111/64 98.9 F (37.2 C) 91 16    Constitutional: Well-developed, well-nourished female in no acute distress.  Cardiovascular: normal rate & rhythm, no murmur Respiratory: normal effort, lung sounds clear throughout GI: Abd soft, non-tender, gravid appropriate for gestational age. Pos BS x 4 MS: Extremities nontender, no edema, normal ROM. Equal muscle strength of BUE Neurologic: Alert and oriented x 4.   NST:  Baseline: 120 bpm, Variability: Good {> 6 bpm), Accelerations: Reactive and Decelerations: Absent   Labs: Results for orders placed or performed in visit on 08/28/18 (from the past 24 hour(s))  POCT Urinalysis Dipstick     Status: Abnormal   Collection Time: 08/28/18  8:48 AM  Result Value Ref Range   Color, UA yellow    Clarity, UA clear    Glucose, UA Negative Negative   Bilirubin, UA +small    Ketones, UA 40 moderate    Spec Grav, UA 1.015 1.010 - 1.025   Blood, UA negative    pH, UA 5.0 5.0 - 8.0   Protein, UA Positive (A) Negative   Urobilinogen, UA 0.2  0.2 or 1.0 E.U./dL   Nitrite, UA negative    Leukocytes, UA Negative Negative   Appearance     Odor      Imaging:  No results found.  MAU Course: Orders Placed This Encounter  Procedures  . Discharge patient   No orders of the defined types were placed in this encounter.   MDM: Reactive NST VSS, NAD Pain likely MSK. Patient with equal strength & ROM bilaterally. Discussed heat/ice, rest, & tylenol. Pt agreeable with plan.   Assessment: 1. Radicular pain of shoulder   2. [redacted] weeks gestation of pregnancy     Plan: Discharge home in stable condition.     Allergies as of 08/28/2018   No Known Allergies     Medication List    STOP taking these medications   hydrOXYzine 25 MG tablet Commonly known as:  ATARAX/VISTARIL   ondansetron 4  MG tablet Commonly known as:  ZOFRAN     TAKE these medications   amoxicillin-clavulanate 875-125 MG tablet Commonly known as:  AUGMENTIN Take 1 tablet by mouth 2 (two) times daily.   DAYTIME COLD MEDICINE PO Take by mouth.   doxylamine (Sleep) 25 MG tablet Commonly known as:  UNISOM Take by mouth.   ferrous sulfate 325 (65 FE) MG tablet Take 1 tablet (325 mg total) by mouth 2 (two) times daily with a meal.   FUSION PLUS Caps Take 1 capsule by mouth daily before breakfast.   pyridOXINE 100 MG tablet Commonly known as:  VITAMIN B-6 Take by mouth.   VITAFOL-NANO 18-0.6-0.4 MG Tabs Take 1 tablet by mouth daily.       Judeth Horn, NP 08/28/2018 8:34 PM

## 2018-08-28 NOTE — H&P (Signed)
Chief Complaint: Shoulder Pain   First Provider Initiated Contact with Patient 08/28/18 0955      SUBJECTIVE  Shannon Delgado is a 32 y.o. G2P1001 who presents to maternity admissions for R shoulder pain and back pain. She denies vaginal bleeding or abdominal pain today. She began to notice R shoulder pain 930pm yesterday evening that predominately involved/traced medially across her trapezius muscle on the posterior superior back and neck. She had one episode of vomiting that evening but has not had any episodes since. She denies hematemesis or bilious emesis. She c/o mild SOB with exertion, mostly up her flight of stairs going into her apt. She denies pleuritic pain or cough. She has had this SOB for approx 2 weeks. No hx of asthma or other respiratory condition. Denies chest pain, numbness or tingling in either arm, weakness of muscles in extremities, or other concerning symptoms.   Past Medical History:  Diagnosis Date  . Anxiety   . Headache    Past Surgical History:  Procedure Laterality Date  . CESAREAN SECTION     Social History   Socioeconomic History  . Marital status: Married    Spouse name: Not on file  . Number of children: Not on file  . Years of education: Not on file  . Highest education level: Not on file  Occupational History  . Not on file  Social Needs  . Financial resource strain: Not hard at all  . Food insecurity:    Worry: Never true    Inability: Never true  . Transportation needs:    Medical: No    Non-medical: Not on file  Tobacco Use  . Smoking status: Never Smoker  . Smokeless tobacco: Never Used  Substance and Sexual Activity  . Alcohol use: Not Currently  . Drug use: Never  . Sexual activity: Yes  Lifestyle  . Physical activity:    Days per week: Not on file    Minutes per session: Not on file  . Stress: To some extent  Relationships  . Social connections:    Talks on phone: Not on file    Gets together: Not on file    Attends  religious service: Not on file    Active member of club or organization: Not on file    Attends meetings of clubs or organizations: Not on file    Relationship status: Not on file  . Intimate partner violence:    Fear of current or ex partner: No    Emotionally abused: No    Physically abused: No    Forced sexual activity: No  Other Topics Concern  . Not on file  Social History Narrative  . Not on file   No current facility-administered medications on file prior to encounter.    Current Outpatient Medications on File Prior to Encounter  Medication Sig Dispense Refill  . Acetaminophen-DM (DAYTIME COLD MEDICINE PO) Take by mouth.    Marland Kitchen amoxicillin-clavulanate (AUGMENTIN) 875-125 MG tablet Take 1 tablet by mouth 2 (two) times daily. 14 tablet 0  . doxylamine, Sleep, (UNISOM) 25 MG tablet Take by mouth.    . ferrous sulfate 325 (65 FE) MG tablet Take 1 tablet (325 mg total) by mouth 2 (two) times daily with a meal. 60 tablet 5  . Iron-FA-B Cmp-C-Biot-Probiotic (FUSION PLUS) CAPS Take 1 capsule by mouth daily before breakfast. 30 capsule 5  . Prenatal-Fe Fum-Methf-FA w/o A (VITAFOL-NANO) 18-0.6-0.4 MG TABS Take 1 tablet by mouth daily. 30 tablet 11  . pyridOXINE (  VITAMIN B-6) 100 MG tablet Take by mouth.     No Known Allergies  ROS:  A 10-system review of systems were negative except as per HPI  I have reviewed patient's Past Medical Hx, Surgical Hx, Family Hx, Social Hx, medications and allergies.   Physical Exam   Patient Vitals for the past 24 hrs:  BP Temp Pulse Resp  08/28/18 0926 111/64 98.9 F (37.2 C) 91 16   Physical Exam  ASSESSMENT 1. Radicular pain of shoulder   2. [redacted] weeks gestation of pregnancy     PLAN Discharge home in stable condition Tylenol PRN for pain RICE Patient may return to MAU as needed or if her condition were to change or worsen   Adella Hare, Medical Student 08/28/2018 11:33 AM

## 2018-08-28 NOTE — MAU Note (Addendum)
Pt presents to MAU stating that she went to Dr Santina Evans office this morning for pain in her right shoulder that radiates into her neck and lower back. Was told by the nurse to come here and be evaluated  Denies any issues with pregnancy at this time. Pt is currently taking amoxicillin for a UTI

## 2018-08-31 ENCOUNTER — Ambulatory Visit (INDEPENDENT_AMBULATORY_CARE_PROVIDER_SITE_OTHER): Payer: Managed Care, Other (non HMO) | Admitting: Obstetrics

## 2018-08-31 ENCOUNTER — Other Ambulatory Visit: Payer: Self-pay

## 2018-08-31 ENCOUNTER — Encounter: Payer: Self-pay | Admitting: Obstetrics

## 2018-08-31 VITALS — BP 94/58 | HR 88 | Wt 157.0 lb

## 2018-08-31 DIAGNOSIS — F40298 Other specified phobia: Secondary | ICD-10-CM

## 2018-08-31 DIAGNOSIS — B955 Unspecified streptococcus as the cause of diseases classified elsewhere: Secondary | ICD-10-CM

## 2018-08-31 DIAGNOSIS — O2340 Unspecified infection of urinary tract in pregnancy, unspecified trimester: Secondary | ICD-10-CM

## 2018-08-31 DIAGNOSIS — O34219 Maternal care for unspecified type scar from previous cesarean delivery: Secondary | ICD-10-CM

## 2018-08-31 DIAGNOSIS — Z3480 Encounter for supervision of other normal pregnancy, unspecified trimester: Secondary | ICD-10-CM

## 2018-08-31 NOTE — Progress Notes (Signed)
Subjective:  Shannon Delgado is a 32 y.o. G2P1001 at [redacted]w[redacted]d being seen today for ongoing prenatal care.  She is currently monitored for the following issues for this low-risk pregnancy and has Supervision of normal pregnancy, antepartum; History of cesarean delivery, currently pregnant; and Fear of examinations on their problem list.  Patient reports no complaints.  Contractions: Irritability. Vag. Bleeding: None.  Movement: Present. Denies leaking of fluid.   The following portions of the patient's history were reviewed and updated as appropriate: allergies, current medications, past family history, past medical history, past social history, past surgical history and problem list. Problem list updated.  Objective:   Vitals:   08/31/18 0908  BP: (!) 94/58  Pulse: 88  Weight: 157 lb (71.2 kg)    Fetal Status: Fetal Heart Rate (bpm): 154   Movement: Present     General:  Alert, oriented and cooperative. Patient is in no acute distress.  Skin: Skin is warm and dry. No rash noted.   Cardiovascular: Normal heart rate noted  Respiratory: Normal respiratory effort, no problems with respiration noted  Abdomen: Soft, gravid, appropriate for gestational age. Pain/Pressure: Present     Pelvic:  Cervical exam deferred        Extremities: Normal range of motion.  Edema: Trace  Mental Status: Normal mood and affect. Normal behavior. Normal judgment and thought content.   Urinalysis:      Assessment and Plan:  Pregnancy: G2P1001 at [redacted]w[redacted]d  1. Supervision of other normal pregnancy, antepartum  2. History of cesarean delivery, currently pregnant  3. Beta hemolytic Streptococcus UTI complicating pregnancy, antepartum  4. Fear of examinations   Term labor symptoms and general obstetric precautions including but not limited to vaginal bleeding, contractions, leaking of fluid and fetal movement were reviewed in detail with the patient. Please refer to After Visit Summary for other counseling  recommendations.  Return in about 4 weeks (around 09/28/2018) for Postpartum Care.   Brock Bad, MD

## 2018-09-01 ENCOUNTER — Other Ambulatory Visit: Payer: Self-pay | Admitting: Family Medicine

## 2018-09-02 NOTE — Anesthesia Preprocedure Evaluation (Addendum)
Anesthesia Evaluation  Patient identified by MRN, date of birth, ID band Patient awake    Reviewed: Allergy & Precautions, NPO status , Patient's Chart, lab work & pertinent test results  History of Anesthesia Complications Negative for: history of anesthetic complications  Airway Mallampati: II  TM Distance: >3 FB Neck ROM: Full    Dental no notable dental hx. (+) Teeth Intact   Pulmonary neg pulmonary ROS,    Pulmonary exam normal breath sounds clear to auscultation       Cardiovascular negative cardio ROS Normal cardiovascular exam Rhythm:Regular Rate:Normal     Neuro/Psych Anxiety negative neurological ROS     GI/Hepatic negative GI ROS, Neg liver ROS,   Endo/Other  negative endocrine ROS  Renal/GU negative Renal ROS     Musculoskeletal negative musculoskeletal ROS (+)   Abdominal   Peds  Hematology  (+) anemia , Hgb 9.6 on 09/04/18   Anesthesia Other Findings Day of surgery medications reviewed with the patient.  Reproductive/Obstetrics (+) Pregnancy                           Anesthesia Physical Anesthesia Plan  ASA: II  Anesthesia Plan: Spinal   Post-op Pain Management:    Induction:   PONV Risk Score and Plan: 2 and Treatment may vary due to age or medical condition, Ondansetron and Dexamethasone  Airway Management Planned: Natural Airway  Additional Equipment:   Intra-op Plan:   Post-operative Plan:   Informed Consent: I have reviewed the patients History and Physical, chart, labs and discussed the procedure including the risks, benefits and alternatives for the proposed anesthesia with the patient or authorized representative who has indicated his/her understanding and acceptance.   Dental advisory given  Plan Discussed with: CRNA  Anesthesia Plan Comments:        Anesthesia Quick Evaluation

## 2018-09-04 ENCOUNTER — Encounter (HOSPITAL_COMMUNITY)
Admission: RE | Admit: 2018-09-04 | Discharge: 2018-09-04 | Disposition: A | Discharge status: Home / Self Care | Payer: Managed Care, Other (non HMO) | Source: Ambulatory Visit | Attending: Family Medicine | Admitting: Family Medicine

## 2018-09-04 LAB — CBC
HEMATOCRIT: 30.2 % — AB (ref 36.0–46.0)
HEMOGLOBIN: 9.6 g/dL — AB (ref 12.0–15.0)
MCH: 23.2 pg — ABNORMAL LOW (ref 26.0–34.0)
MCHC: 31.8 g/dL (ref 30.0–36.0)
MCV: 72.9 fL — AB (ref 80.0–100.0)
Platelets: 229 10*3/uL (ref 150–400)
RBC: 4.14 MIL/uL (ref 3.87–5.11)
RDW: 19 % — ABNORMAL HIGH (ref 11.5–15.5)
WBC: 13.1 10*3/uL — ABNORMAL HIGH (ref 4.0–10.5)
nRBC: 0 % (ref 0.0–0.2)

## 2018-09-04 LAB — TYPE AND SCREEN
ABO/RH(D): B POS
Antibody Screen: NEGATIVE

## 2018-09-04 LAB — RAPID HIV SCREEN (HIV 1/2 AB+AG)
HIV 1/2 Antibodies: NONREACTIVE
HIV-1 P24 ANTIGEN - HIV24: NONREACTIVE

## 2018-09-04 LAB — ABO/RH: ABO/RH(D): B POS

## 2018-09-04 NOTE — Patient Instructions (Signed)
Shannon Delgado  09/04/2018   Your procedure is scheduled on:  09/05/2018  Enter through the Main Entrance of [facility] at 0815 AM.  Pick up the phone at the desk and dial [phone]  Call this number if you have problems the morning of surgery:[phone]  Remember:   Do not eat food:(After Midnight) Desps de medianoche.  Do not drink clear liquids: (After Midnight) Desps de medianoche.  Take these medicines the morning of surgery with A SIP OF WATER: none   Do not wear jewelry, make-up or nail polish.  Do not wear lotions, powders, or perfumes. Do not wear deodorant.  Do not shave 48 hours prior to surgery.  Do not bring valuables to the hospital.  [facility] is not   responsible for any belongings or valuables brought to the hospital.  Contacts, dentures or bridgework may not be worn into surgery.  Leave suitcase in the car. After surgery it may be brought to your room.  For patients admitted to the hospital, checkout time is 11:00 AM the day of              discharge.    N/A   Please read over the following fact sheets that you were given:   Surgical Site Infection Prevention

## 2018-09-05 ENCOUNTER — Inpatient Hospital Stay (HOSPITAL_COMMUNITY): Payer: Managed Care, Other (non HMO) | Admitting: Anesthesiology

## 2018-09-05 ENCOUNTER — Encounter (HOSPITAL_COMMUNITY): Payer: Self-pay

## 2018-09-05 ENCOUNTER — Inpatient Hospital Stay (HOSPITAL_COMMUNITY)
Admission: RE | Admit: 2018-09-05 | Discharge: 2018-09-08 | DRG: 788 | Disposition: A | Discharge status: Home / Self Care | Payer: Managed Care, Other (non HMO) | Attending: Family Medicine | Admitting: Family Medicine

## 2018-09-05 ENCOUNTER — Encounter (HOSPITAL_COMMUNITY)
Admission: RE | Disposition: A | Discharge status: Home / Self Care | Payer: Self-pay | Source: Home / Self Care | Attending: Family Medicine

## 2018-09-05 DIAGNOSIS — D649 Anemia, unspecified: Secondary | ICD-10-CM | POA: Diagnosis present

## 2018-09-05 DIAGNOSIS — Z98891 History of uterine scar from previous surgery: Secondary | ICD-10-CM

## 2018-09-05 DIAGNOSIS — O99824 Streptococcus B carrier state complicating childbirth: Secondary | ICD-10-CM | POA: Diagnosis present

## 2018-09-05 DIAGNOSIS — O34211 Maternal care for low transverse scar from previous cesarean delivery: Principal | ICD-10-CM | POA: Diagnosis present

## 2018-09-05 DIAGNOSIS — O34219 Maternal care for unspecified type scar from previous cesarean delivery: Secondary | ICD-10-CM | POA: Diagnosis not present

## 2018-09-05 DIAGNOSIS — O9902 Anemia complicating childbirth: Secondary | ICD-10-CM | POA: Diagnosis present

## 2018-09-05 DIAGNOSIS — Z3A39 39 weeks gestation of pregnancy: Secondary | ICD-10-CM

## 2018-09-05 LAB — RPR: RPR Ser Ql: NONREACTIVE

## 2018-09-05 SURGERY — Surgical Case
Anesthesia: Spinal

## 2018-09-05 MED ORDER — KETOROLAC TROMETHAMINE 30 MG/ML IJ SOLN: 30.0000 mg | Freq: Once | INTRAMUSCULAR | Status: DC | PRN

## 2018-09-05 MED ORDER — OXYCODONE HCL 5 MG PO TABS
5.0000 mg | ORAL_TABLET | ORAL | Status: DC | PRN
  Administered 2018-09-06 – 2018-09-07 (×2): 5 mg via ORAL
  Filled 2018-09-05 (×2): qty 1

## 2018-09-05 MED ORDER — DIPHENHYDRAMINE HCL 50 MG/ML IJ SOLN: 12.5000 mg | INTRAMUSCULAR | Status: DC | PRN

## 2018-09-05 MED ORDER — MORPHINE SULFATE (PF) 0.5 MG/ML IJ SOLN
INTRAMUSCULAR | Status: AC
  Filled 2018-09-05: qty 10

## 2018-09-05 MED ORDER — CEFAZOLIN SODIUM-DEXTROSE 2-4 GM/100ML-% IV SOLN
2.0000 g | INTRAVENOUS | Status: AC
  Administered 2018-09-05: 2 g via INTRAVENOUS
  Filled 2018-09-05: qty 100

## 2018-09-05 MED ORDER — NALBUPHINE HCL 10 MG/ML IJ SOLN: 5.0000 mg | INTRAMUSCULAR | Status: DC | PRN

## 2018-09-05 MED ORDER — MENTHOL 3 MG MT LOZG: 1.0000 | LOZENGE | OROMUCOSAL | Status: DC | PRN

## 2018-09-05 MED ORDER — NALBUPHINE HCL 10 MG/ML IJ SOLN: 5.0000 mg | Freq: Once | INTRAMUSCULAR | Status: DC | PRN

## 2018-09-05 MED ORDER — ENOXAPARIN SODIUM 40 MG/0.4ML ~~LOC~~ SOLN
40.0000 mg | SUBCUTANEOUS | Status: DC
  Administered 2018-09-06 – 2018-09-08 (×2): 40 mg via SUBCUTANEOUS
  Filled 2018-09-05 (×4): qty 0.4

## 2018-09-05 MED ORDER — IBUPROFEN 600 MG PO TABS
600.0000 mg | ORAL_TABLET | Freq: Four times a day (QID) | ORAL | Status: DC
  Administered 2018-09-05 – 2018-09-08 (×8): 600 mg via ORAL
  Filled 2018-09-05 (×11): qty 1

## 2018-09-05 MED ORDER — OXYTOCIN 10 UNIT/ML IJ SOLN
INTRAVENOUS | Status: DC | PRN
  Administered 2018-09-05: 40 [IU] via INTRAVENOUS

## 2018-09-05 MED ORDER — ACETAMINOPHEN 325 MG PO TABS
650.0000 mg | ORAL_TABLET | ORAL | Status: DC | PRN
  Administered 2018-09-08: 650 mg via ORAL
  Filled 2018-09-05: qty 2

## 2018-09-05 MED ORDER — LACTATED RINGERS IV SOLN
INTRAVENOUS | Status: DC
  Administered 2018-09-05 (×2): via INTRAVENOUS

## 2018-09-05 MED ORDER — NALOXONE HCL 0.4 MG/ML IJ SOLN: 0.4000 mg | INTRAMUSCULAR | Status: DC | PRN

## 2018-09-05 MED ORDER — SENNOSIDES-DOCUSATE SODIUM 8.6-50 MG PO TABS
2.0000 | ORAL_TABLET | ORAL | Status: DC
  Administered 2018-09-05 – 2018-09-07 (×2): 2 via ORAL
  Filled 2018-09-05 (×3): qty 2

## 2018-09-05 MED ORDER — MEASLES, MUMPS & RUBELLA VAC IJ SOLR
0.5000 mL | Freq: Once | INTRAMUSCULAR | Status: DC
  Filled 2018-09-05: qty 0.5

## 2018-09-05 MED ORDER — DEXAMETHASONE SODIUM PHOSPHATE 4 MG/ML IJ SOLN
INTRAMUSCULAR | Status: AC
  Filled 2018-09-05: qty 1

## 2018-09-05 MED ORDER — COCONUT OIL OIL
1.0000 "application " | TOPICAL_OIL | Status: DC | PRN
  Filled 2018-09-05: qty 120

## 2018-09-05 MED ORDER — PHENYLEPHRINE 8 MG IN D5W 100 ML (0.08MG/ML) PREMIX OPTIME
INJECTION | INTRAVENOUS | Status: DC | PRN
  Administered 2018-09-05: 60 ug/min via INTRAVENOUS

## 2018-09-05 MED ORDER — DIPHENHYDRAMINE HCL 25 MG PO CAPS
25.0000 mg | ORAL_CAPSULE | Freq: Four times a day (QID) | ORAL | Status: DC | PRN
  Administered 2018-09-05: 25 mg via ORAL
  Filled 2018-09-05: qty 1

## 2018-09-05 MED ORDER — SOD CITRATE-CITRIC ACID 500-334 MG/5ML PO SOLN
30.0000 mL | ORAL | Status: AC
  Administered 2018-09-05: 30 mL via ORAL
  Filled 2018-09-05: qty 15

## 2018-09-05 MED ORDER — WITCH HAZEL-GLYCERIN EX PADS: 1.0000 "application " | MEDICATED_PAD | CUTANEOUS | Status: DC | PRN

## 2018-09-05 MED ORDER — SIMETHICONE 80 MG PO CHEW: 80.0000 mg | CHEWABLE_TABLET | ORAL | Status: DC | PRN

## 2018-09-05 MED ORDER — PROMETHAZINE HCL 25 MG/ML IJ SOLN: 6.2500 mg | INTRAMUSCULAR | Status: DC | PRN

## 2018-09-05 MED ORDER — SODIUM CHLORIDE 0.9% FLUSH: 3.0000 mL | INTRAVENOUS | Status: DC | PRN

## 2018-09-05 MED ORDER — BUPIVACAINE IN DEXTROSE 0.75-8.25 % IT SOLN
INTRATHECAL | Status: DC | PRN
  Administered 2018-09-05: 1.6 mL via INTRATHECAL

## 2018-09-05 MED ORDER — OXYTOCIN 10 UNIT/ML IJ SOLN
INTRAMUSCULAR | Status: AC
  Filled 2018-09-05: qty 4

## 2018-09-05 MED ORDER — SIMETHICONE 80 MG PO CHEW
80.0000 mg | CHEWABLE_TABLET | ORAL | Status: DC
  Administered 2018-09-05 – 2018-09-07 (×2): 80 mg via ORAL
  Filled 2018-09-05 (×3): qty 1

## 2018-09-05 MED ORDER — PRENATAL MULTIVITAMIN CH
1.0000 | ORAL_TABLET | Freq: Every day | ORAL | Status: DC
  Administered 2018-09-06 – 2018-09-07 (×2): 1 via ORAL
  Filled 2018-09-05 (×2): qty 1

## 2018-09-05 MED ORDER — KETOROLAC TROMETHAMINE 30 MG/ML IJ SOLN: 30.0000 mg | Freq: Four times a day (QID) | INTRAMUSCULAR | Status: AC | PRN

## 2018-09-05 MED ORDER — ONDANSETRON HCL 4 MG/2ML IJ SOLN
INTRAMUSCULAR | Status: DC | PRN
  Administered 2018-09-05: 4 mg via INTRAVENOUS

## 2018-09-05 MED ORDER — LACTATED RINGERS IV SOLN
INTRAVENOUS | Status: DC | PRN
  Administered 2018-09-05: 11:00:00 via INTRAVENOUS

## 2018-09-05 MED ORDER — DEXAMETHASONE SODIUM PHOSPHATE 4 MG/ML IJ SOLN
INTRAMUSCULAR | Status: DC | PRN
  Administered 2018-09-05: 4 mg via INTRAVENOUS

## 2018-09-05 MED ORDER — NALOXONE HCL 4 MG/10ML IJ SOLN
1.0000 ug/kg/h | INTRAVENOUS | Status: DC | PRN
  Filled 2018-09-05: qty 5

## 2018-09-05 MED ORDER — KETOROLAC TROMETHAMINE 30 MG/ML IJ SOLN
INTRAMUSCULAR | Status: AC
  Administered 2018-09-05: 30 mg
  Filled 2018-09-05: qty 1

## 2018-09-05 MED ORDER — SODIUM CHLORIDE 0.9 % IR SOLN
Status: DC | PRN
  Administered 2018-09-05: 1

## 2018-09-05 MED ORDER — DIPHENHYDRAMINE HCL 25 MG PO CAPS
25.0000 mg | ORAL_CAPSULE | ORAL | Status: DC | PRN
  Filled 2018-09-05: qty 1

## 2018-09-05 MED ORDER — OXYTOCIN 40 UNITS IN LACTATED RINGERS INFUSION - SIMPLE MED: 2.5000 [IU]/h | INTRAVENOUS | Status: AC

## 2018-09-05 MED ORDER — FENTANYL CITRATE (PF) 100 MCG/2ML IJ SOLN: 25.0000 ug | INTRAMUSCULAR | Status: DC | PRN

## 2018-09-05 MED ORDER — ZOLPIDEM TARTRATE 5 MG PO TABS: 5.0000 mg | ORAL_TABLET | Freq: Every evening | ORAL | Status: DC | PRN

## 2018-09-05 MED ORDER — SIMETHICONE 80 MG PO CHEW
80.0000 mg | CHEWABLE_TABLET | Freq: Three times a day (TID) | ORAL | Status: DC
  Administered 2018-09-06 – 2018-09-08 (×6): 80 mg via ORAL
  Filled 2018-09-05 (×8): qty 1

## 2018-09-05 MED ORDER — TETANUS-DIPHTH-ACELL PERTUSSIS 5-2.5-18.5 LF-MCG/0.5 IM SUSP: 0.5000 mL | Freq: Once | INTRAMUSCULAR | Status: DC

## 2018-09-05 MED ORDER — MORPHINE SULFATE (PF) 0.5 MG/ML IJ SOLN
INTRAMUSCULAR | Status: DC | PRN
  Administered 2018-09-05: .15 mg via INTRATHECAL

## 2018-09-05 MED ORDER — LACTATED RINGERS IV SOLN
INTRAVENOUS | Status: DC
  Administered 2018-09-05 (×2): via INTRAVENOUS

## 2018-09-05 MED ORDER — OXYCODONE HCL 5 MG PO TABS
10.0000 mg | ORAL_TABLET | ORAL | Status: DC | PRN
  Administered 2018-09-06 – 2018-09-07 (×4): 10 mg via ORAL
  Filled 2018-09-05 (×5): qty 2

## 2018-09-05 MED ORDER — ONDANSETRON HCL 4 MG/2ML IJ SOLN
4.0000 mg | Freq: Three times a day (TID) | INTRAMUSCULAR | Status: DC | PRN
  Administered 2018-09-05: 4 mg via INTRAVENOUS
  Filled 2018-09-05: qty 2

## 2018-09-05 MED ORDER — FENTANYL CITRATE (PF) 100 MCG/2ML IJ SOLN
INTRAMUSCULAR | Status: DC | PRN
  Administered 2018-09-05: 15 ug via INTRATHECAL

## 2018-09-05 MED ORDER — DIBUCAINE 1 % RE OINT: 1.0000 "application " | TOPICAL_OINTMENT | RECTAL | Status: DC | PRN

## 2018-09-05 MED ORDER — ONDANSETRON HCL 4 MG/2ML IJ SOLN
INTRAMUSCULAR | Status: AC
  Filled 2018-09-05: qty 2

## 2018-09-05 MED ORDER — FENTANYL CITRATE (PF) 100 MCG/2ML IJ SOLN
INTRAMUSCULAR | Status: AC
  Filled 2018-09-05: qty 2

## 2018-09-05 MED ORDER — ACETAMINOPHEN 500 MG PO TABS
1000.0000 mg | ORAL_TABLET | Freq: Four times a day (QID) | ORAL | Status: AC
  Administered 2018-09-05 (×2): 1000 mg via ORAL
  Filled 2018-09-05 (×4): qty 2

## 2018-09-05 SURGICAL SUPPLY — 32 items
BENZOIN TINCTURE PRP APPL 2/3 (GAUZE/BANDAGES/DRESSINGS) ×3 IMPLANT
CHLORAPREP W/TINT 26ML (MISCELLANEOUS) ×3 IMPLANT
CLAMP CORD UMBIL (MISCELLANEOUS) IMPLANT
CLOSURE WOUND 1/2 X4 (GAUZE/BANDAGES/DRESSINGS) ×1
CLOTH BEACON ORANGE TIMEOUT ST (SAFETY) ×3 IMPLANT
DRSG OPSITE POSTOP 4X10 (GAUZE/BANDAGES/DRESSINGS) ×3 IMPLANT
ELECT REM PT RETURN 9FT ADLT (ELECTROSURGICAL) ×3
ELECTRODE REM PT RTRN 9FT ADLT (ELECTROSURGICAL) ×1 IMPLANT
EXTRACTOR VACUUM M CUP 4 TUBE (SUCTIONS) IMPLANT
EXTRACTOR VACUUM M CUP 4' TUBE (SUCTIONS)
GLOVE BIOGEL PI IND STRL 7.0 (GLOVE) ×2 IMPLANT
GLOVE BIOGEL PI IND STRL 7.5 (GLOVE) ×2 IMPLANT
GLOVE BIOGEL PI INDICATOR 7.0 (GLOVE) ×4
GLOVE BIOGEL PI INDICATOR 7.5 (GLOVE) ×4
GLOVE ECLIPSE 7.5 STRL STRAW (GLOVE) ×3 IMPLANT
GOWN STRL REUS W/TWL LRG LVL3 (GOWN DISPOSABLE) ×9 IMPLANT
KIT ABG SYR 3ML LUER SLIP (SYRINGE) IMPLANT
NEEDLE HYPO 25X5/8 SAFETYGLIDE (NEEDLE) IMPLANT
NS IRRIG 1000ML POUR BTL (IV SOLUTION) ×3 IMPLANT
PACK C SECTION WH (CUSTOM PROCEDURE TRAY) ×3 IMPLANT
PAD OB MATERNITY 4.3X12.25 (PERSONAL CARE ITEMS) ×3 IMPLANT
PENCIL SMOKE EVAC W/HOLSTER (ELECTROSURGICAL) ×3 IMPLANT
RTRCTR C-SECT PINK 25CM LRG (MISCELLANEOUS) ×3 IMPLANT
SPONGE LAP 18X18 RF (DISPOSABLE) ×9 IMPLANT
STRIP CLOSURE SKIN 1/2X4 (GAUZE/BANDAGES/DRESSINGS) ×2 IMPLANT
SUT VIC AB 0 CTX 36 (SUTURE) ×8
SUT VIC AB 0 CTX36XBRD ANBCTRL (SUTURE) ×4 IMPLANT
SUT VIC AB 2-0 CT1 27 (SUTURE) ×2
SUT VIC AB 2-0 CT1 TAPERPNT 27 (SUTURE) ×1 IMPLANT
SUT VIC AB 4-0 KS 27 (SUTURE) ×3 IMPLANT
TOWEL OR 17X24 6PK STRL BLUE (TOWEL DISPOSABLE) ×3 IMPLANT
TRAY FOLEY W/BAG SLVR 14FR LF (SET/KITS/TRAYS/PACK) ×3 IMPLANT

## 2018-09-05 NOTE — Anesthesia Procedure Notes (Signed)
Spinal  Patient location during procedure: OR Start time: 09/05/2018 10:14 AM End time: 09/05/2018 10:17 AM Staffing Anesthesiologist: Kaylyn LayerHowze, Wylma Tatem E, MD Performed: anesthesiologist  Preanesthetic Checklist Completed: patient identified, site marked, pre-op evaluation, timeout performed, IV checked, risks and benefits discussed and monitors and equipment checked Spinal Block Patient position: sitting Prep: ChloraPrep Patient monitoring: heart rate, cardiac monitor and continuous pulse ox Approach: midline Location: L3-4 Injection technique: single-shot Needle Needle type: Pencan  Needle gauge: 24 G Needle length: 10 cm Assessment Sensory level: T4 Additional Notes Risks, benefits, and alternative discussed. Patient gave consent to procedure. Prepped and draped in sitting position. Clear CSF obtained after one needle redirection. Positive terminal aspiration. No pain or paraesthesias with injection. Patient tolerated procedure well. Vital signs stable. Amalia GreenhouseKE Apollonia Amini, MD

## 2018-09-05 NOTE — Lactation Note (Signed)
This note was copied from a baby's chart. Lactation Consultation Note  Patient Name: Shannon Delgado ZOXWR'U Date: 09/05/2018 Reason for consult: Initial assessment;Term  8 hours old FT female who is being exclusively BF by her mother, she's a P2. Mom is experienced BF, she was able to BF her first child for 16 months but experienced BF difficulties in the beginning. Mom got engorged while at the hospital; she said she was only given a hand pump and didn't do anything to treat/prevent the engorgement; she needed a DEBP. Engorgement prevention and treatment was discussed, LC offered to set up a DEBP for her hospital stay. Pump instructions, cleaning and storage were reviewed.  Mom is already familiar with hand expression, when reviewing hand expression, noticed that mom's nipples are semi-flat and semi-compressible. [place] set her up with breast shells, instructions, cleaning and storage was also reviewed. No colostrum noted during hand expression. She has an [place] that she brought to the hospital, but she no longer has the pump kit. LC showed mom how to convert her hospital pump kit for home use. Dad is the insurance carrier, he also mentioned mom will be getting a new DEBP through his insurance but that he hasn't ordered it yet.  Offered assistance with latch, but mom politely declined stating that baby wasn't due for a feeding, she was asleep. Asked mom to call for assistance when needed. Mom and dad were very engaged in [facility] consultation, cluster feeding, lactogenesis II and baby sleeping cycle were discussed. Mom still unsure if she could have oversupply issues, her milk came in on the third day of her hospital stay with her first baby, LC gave mom some tips about how to down-regulate her milk production in case she were to get engorged again.  Feeding plan:  1. Encouraged mom to feed baby STS 8-12 times/24 hours or sooner if feeding cues are present 2. Mom will pump after feeding about 6  times/24 hours upon her discretion and will feed baby any amount of EBM she may get  BF brochure, BF resources and feeding diary were reviewed. Parents reported all questions and concerns were answered, they're both aware of [place] services and will call PRN.  Maternal Data Formula Feeding for Exclusion: No Has patient been taught Hand Expression?: Yes Does the patient have breastfeeding experience prior to this delivery?: Yes  Feeding Feeding Type: Breast Fed  Interventions Interventions: Shells;Reverse pressure;Hand express;Breast compression;DEBP;Breast massage;Breast feeding basics reviewed  Lactation Tools Discussed/Used Tools: Pump;Shells Shell Type: Inverted Breast pump type: Double-Electric Breast Pump WIC Program: No Pump Review: Setup, frequency, and cleaning Initiated by:: MPeck Date initiated:: 09/05/18   Consult Status Consult Status: Follow-up Date: 09/06/18 Follow-up type: In-patient    Shannon Delgado Francene Boyers 09/05/2018, 7:28 PM

## 2018-09-05 NOTE — Op Note (Signed)
Cesarean Section Operative Report  PATIENT: Shannon Delgado  PROCEDURE DATE: 09/05/2018  PREOPERATIVE DIAGNOSES: Intrauterine pregnancy at [redacted]w[redacted]d weeks gestation; patient declines vag del attempt  POSTOPERATIVE DIAGNOSES: The same  PROCEDURE: Repeat Low Transverse Cesarean Section  SURGEON:   Surgeon(s) and Role:    * Levie Heritage, DO - Primary    * Arvilla Market, DO - Assisting - OB Fellow    * Oralia Manis, DO - Assisting - Resident    INDICATIONS: Shannon Delgado is a 32 y.o. G2P1001 at [redacted]w[redacted]d here for cesarean section secondary to the indications listed under preoperative diagnoses; please see preoperative note for further details.  The risks of cesarean section were discussed with the patient including but were not limited to: bleeding which may require transfusion or reoperation; infection which may require antibiotics; injury to bowel, bladder, ureters or other surrounding organs; injury to the fetus; need for additional procedures including hysterectomy in the event of a life-threatening hemorrhage; placental abnormalities wth subsequent pregnancies, incisional problems, thromboembolic phenomenon and other postoperative/anesthesia complications.   The patient concurred with the proposed plan, giving informed written consent for the procedure.    FINDINGS:  Viable female infant in cephalic presentation.  Apgars 9 and 9.  Clear amniotic fluid.  Intact placenta, three vessel cord.  Normal uterus, fallopian tubes and ovaries bilaterally.  ANESTHESIA: Spinal INTRAVENOUS FLUIDS: 2250 mL  Quanitative BLOOD LOSS: 194 mL  EBL: 500 mL  URINE OUTPUT:  200 ml SPECIMENS: Placenta sent to L&D COMPLICATIONS: None immediate  PROCEDURE IN DETAIL:  The patient preoperatively received intravenous antibiotics and had sequential compression devices applied to her lower extremities.  She was then taken to the operating room where spinal anesthesia was  administered and was found to be adequate. She was then placed in a dorsal supine position with a leftward tilt, and prepped and draped in a sterile manner.  A foley catheter was placed into her bladder and attached to constant gravity.    After an adequate timeout was performed, a Pfannenstiel skin incision was made with scalpel over her preexisting scar and carried through to the underlying layer of fascia. The fascia was incised in the midline, and this incision was extended bilaterally using the Mayo scissors.  Kocher clamps were applied to the superior aspect of the fascial incision and the underlying rectus muscles were dissected off bluntly.  A similar process was carried out on the inferior aspect of the fascial incision. The rectus muscles were separated in the midline bluntly and the peritoneum was entered bluntly. Attention was turned to the lower uterine segment where a low transverse hysterotomy was made with a scalpel and extended bilaterally bluntly.  The infant was successfully delivered, the cord was clamped and cut after one minute, and the infant was handed over to the awaiting neonatology team. Uterine massage was then administered, and the placenta delivered intact with a three-vessel cord. The uterus was then cleared of clots and debris.  The hysterotomy was closed with 0 Vicryl in a running locked fashion, and an imbricating layer was also placed with 0 Vicryl.  Figure-of-eight 0 Vicryl serosal stitches were placed to help with hemostasis.  The pelvis was cleared of all clot and debris. Hemostasis was confirmed on all surfaces.  The peritoneum was closed with a 0 Vicryl running stitch. The fascia was then closed using 0 Vicryl in a running fashion.  The subcutaneous layer was irrigated. The skin was closed with a 4-0 Vicryl subcuticular  stitch.   The patient tolerated the procedure well. Sponge, lap, instrument and needle counts were correct x 3.  She was taken to the recovery room in  stable condition.   An experienced assistant was required given the standard of surgical care given the complexity of the case.  This assistant was needed for exposure, dissection, suctioning, retraction, instrument exchange, assisting with delivery with administration of fundal pressure, and for overall help during the procedure.   Maternal Disposition: PACU - hemodynamically stable.   Infant Disposition: stable   Marcy Siren, D.O. OB Fellow  09/05/2018, 11:47 AM

## 2018-09-05 NOTE — H&P (Signed)
Obstetric Preoperative History and Physical  Shannon Delgado is a 32 y.o. G2P1001 with IUP at 8127w0d presenting for scheduled cesarean section.  No acute concerns.   Prenatal Course Source of Care: [facility]  with onset of care at 11 weeks Pregnancy complications or risks: Patient Active Problem List   Diagnosis Date Noted  . Supervision of normal pregnancy, antepartum 02/24/2018  . History of cesarean delivery, currently pregnant 02/24/2018  . Fear of examinations 02/24/2018   She plans to breastfeed She desires oral progesterone-only contraceptive for postpartum contraception.   Prenatal labs and studies: ABO, Rh: --/--/B POS, B POS Performed at [facility], [address] [place], [place] [address]  [id number](11/11 1042) Antibody: NEG (11/11 1042) Rubella: 2.16 (05/03 1156) RPR: Non Reactive (11/11 1042)  HBsAg: Negative (05/03 1156)  HIV: NON REACTIVE (11/11 1042)  GBS: Positive  2 hr Glucola  Negative Genetic screening Declined Anatomy US normal  Prenatal Transfer Tool  Maternal Diabetes: No Genetic Screening: Normal Maternal Ultrasounds/Referrals: Normal Fetal Ultrasounds or other Referrals:  None Maternal Substance Abuse:  No Significant Maternal Medications:  None Significant Maternal Lab Results: Lab values include: Group B Strep positive  Past Medical History:  Diagnosis Date  . Anxiety   . Headache     Past Surgical History:  Procedure Laterality Date  . CESAREAN SECTION    . KNEE SURGERY Right     OB History  Gravida Para Term Preterm AB Living  2 1 1     1   SAB TAB Ectopic Multiple Live Births          1    # Outcome Date GA Lbr Len/2nd Weight Sex Delivery Anes PTL Lv  2 Current           1 Term 2015 6327w0d    CS-LTranv   LIV    Obstetric Comments  Primary LTCS due to anxiety about pelvic/vaginal examination    Social History   Socioeconomic History  . Marital status: Married    Spouse name: Not on file  . Number of children: Not  on file  . Years of education: Not on file  . Highest education level: Not on file  Occupational History  . Not on file  Social Needs  . Financial resource strain: Not hard at all  . Food insecurity:    Worry: Never true    Inability: Never true  . Transportation needs:    Medical: No    Non-medical: Not on file  Tobacco Use  . Smoking status: Never Smoker  . Smokeless tobacco: Never Used  Substance and Sexual Activity  . Alcohol use: Not Currently  . Drug use: Never  . Sexual activity: Yes  Lifestyle  . Physical activity:    Days per week: Not on file    Minutes per session: Not on file  . Stress: To some extent  Relationships  . Social connections:    Talks on phone: Not on file    Gets together: Not on file    Attends religious service: Not on file    Active member of club or organization: Not on file    Attends meetings of clubs or organizations: Not on file    Relationship status: Not on file  Other Topics Concern  . Not on file  Social History Narrative  . Not on file    Family History  Problem Relation Age of Onset  . Breast cancer Paternal Aunt [id number]  . Diabetes Paternal Aunt   .  Diabetes Father   . Hypertension Father   . Anxiety disorder Sister   . Depression Sister   . Cancer Maternal Aunt   . Diabetes Paternal Grandfather   . Asthma Other     Medications Prior to Admission  Medication Sig Dispense Refill Last Dose  . doxylamine, Sleep, (UNISOM) 25 MG tablet Take 25 mg by mouth at bedtime as needed for sleep.    09/04/2018 at Unknown time  . Iron-FA-B Cmp-C-Biot-Probiotic (FUSION PLUS) CAPS Take 1 capsule by mouth daily before breakfast. (Patient taking differently: Take 1 capsule by mouth every evening. ) 30 capsule 5 Past Week at Unknown time  . ondansetron (ZOFRAN) 4 MG tablet Take 4 mg by mouth every 8 (eight) hours as needed for nausea or vomiting.   Past Week at Unknown time  . Prenatal MV & Min w/FA-DHA ([employer] PRENATAL GUMMY PO) Take 2 tablets by  mouth daily.   09/04/2018 at Unknown time    No Known Allergies  Review of Systems: Negative except for what is mentioned in HPI.  Physical Exam: BP 118/67   Pulse 91   Temp 97.9 F (36.6 C) (Oral)   Resp 18   Ht 5\' 2"  (1.575 m)   Wt 70.6 kg   LMP 12/06/2017 (Exact Date)   BMI 28.46 kg/m  FHR by Doppler: 149 bpm CONSTITUTIONAL: Well-developed, well-nourished female in no acute distress.  HENT:  Normocephalic, atraumatic. Oropharynx is clear and moist EYES: Conjunctivae and EOM are normal. No scleral icterus.  NECK: Normal range of motion, supple SKIN: Skin is warm and dry. No rash noted. Not diaphoretic. No erythema. NEUROLGIC: Alert and oriented to person, place, and time. Normal reflexes, muscle tone coordination. No cranial nerve deficit noted. PSYCHIATRIC: Normal mood and affect. Normal behavior. CARDIOVASCULAR: Normal heart rate noted, regular rhythm RESPIRATORY: Effort and breath sounds normal, no problems with respiration noted ABDOMEN: Soft, nontender, nondistended, gravid. Well-healed Pfannenstiel incision. PELVIC: Deferred MUSCULOSKELETAL: Normal range of motion. No edema and no tenderness. 2+ distal pulses.   Pertinent Labs/Studies:   Results for orders placed or performed during the hospital encounter of 09/04/18 (from the past 72 hour(s))  CBC     Status: Abnormal   Collection Time: 09/04/18 10:42 AM  Result Value Ref Range   WBC 13.1 (H) 4.0 - 10.5 K/uL   RBC 4.14 3.87 - 5.11 MIL/uL   Hemoglobin 9.6 (L) 12.0 - 15.0 g/dL   HCT 16.1 (L) 09.6 - 04.5 %   MCV 72.9 (L) 80.0 - 100.0 fL   MCH 23.2 (L) 26.0 - 34.0 pg   MCHC 31.8 30.0 - 36.0 g/dL   RDW 40.9 (H) 81.1 - 91.4 %   Platelets 229 150 - 400 K/uL    Comment: SPECIMEN CHECKED FOR CLOTS REPEATED TO VERIFY    nRBC 0.0 0.0 - 0.2 %    Comment: Performed at [facility], [address], [place], [place] [address]  Rapid HIV screen (HIV 1/2 Ab+Ag)     Status: None   Collection Time: 09/04/18 10:42 AM   Result Value Ref Range   HIV-1 P24 Antigen - HIV24 NON REACTIVE NON REACTIVE   HIV 1/2 Antibodies NON REACTIVE NON REACTIVE   Interpretation (HIV Ag Ab)      A non reactive test result means that HIV 1 or HIV 2 antibodies and HIV 1 p24 antigen were not detected in the specimen.    Comment: Performed at [facility], [address] [place], [place] [address]  RPR  Status: None   Collection Time: 09/04/18 10:42 AM  Result Value Ref Range   RPR Ser Ql Non Reactive Non Reactive    Comment: (NOTE) Performed At: [facility] [address] [place], [place] [id number] Jolene Schimke MD WU:[id number]   Type and screen     Status: None   Collection Time: 09/04/18 10:42 AM  Result Value Ref Range   ABO/RH(D) B POS    Antibody Screen NEG    Sample Expiration      09/07/2018 Performed at [facility], [address] [place], [place] [address]   ABO/Rh     Status: None   Collection Time: 09/04/18 10:42 AM  Result Value Ref Range   ABO/RH(D)      B POS Performed at [facility], [address], [place], [place] [address]     Assessment and Plan :Shannon Delgado is a 32 y.o. G2P1001 at [redacted]w[redacted]d being admitted for scheduled cesarean section. The risks of cesarean section discussed with the patient included but were not limited to: bleeding which may require transfusion or reoperation (patient declines blood products and consent for refusal has been signed); infection which may require antibiotics; injury to bowel, bladder, ureters or other surrounding organs; injury to the fetus; need for additional procedures including hysterectomy in the event of a life-threatening hemorrhage; placental abnormalities wth subsequent pregnancies, incisional problems, thromboembolic phenomenon and other postoperative/anesthesia complications. The patient concurred with the proposed plan, giving informed written consent for the procedure. Patient has been NPO since last night she  will remain NPO for procedure. Anesthesia and OR aware. Preoperative prophylactic antibiotics and SCDs ordered on call to the OR. To OR when ready.    Marcy Siren, D.O. OB Fellow  09/05/2018, 8:43 AM

## 2018-09-05 NOTE — Transfer of Care (Signed)
Immediate Anesthesia Transfer of Care Note  Patient: Shannon Delgado  Procedure(s) Performed: CESAREAN SECTION (N/A )  Patient Location: PACU  Anesthesia Type:Spinal  Level of Consciousness: awake, alert  and oriented  Airway & Oxygen Therapy: Patient Spontanous Breathing  Post-op Assessment: Report given to RN and Post -op Vital signs reviewed and stable  Post vital signs: Reviewed and stable  Last Vitals:  Vitals Value Taken Time  BP    Temp    Pulse 98 09/05/2018 11:50 AM  Resp 18 09/05/2018 11:50 AM  SpO2 95 % 09/05/2018 11:50 AM  Vitals shown include unvalidated device data.  Last Pain:  Vitals:   09/05/18 0827  TempSrc: Oral  PainSc:          Complications: No apparent anesthesia complications

## 2018-09-05 NOTE — Anesthesia Postprocedure Evaluation (Signed)
Anesthesia Post Note  Patient: Shannon Delgado  Procedure(s) Performed: CESAREAN SECTION (N/A )     Patient location during evaluation: PACU Anesthesia Type: Spinal Level of consciousness: awake and alert Pain management: pain level controlled Vital Signs Assessment: post-procedure vital signs reviewed and stable Respiratory status: spontaneous breathing, nonlabored ventilation and respiratory function stable Cardiovascular status: blood pressure returned to baseline and stable Postop Assessment: no apparent nausea or vomiting and spinal receding Anesthetic complications: no    Last Vitals:  Vitals:   09/05/18 1255 09/05/18 1400  BP: 113/68 109/67  Pulse: 71 77  Resp: 16 17  Temp: 36.6 C 36.7 C  SpO2: 100% 100%    Last Pain:  Vitals:   09/05/18 1400  TempSrc: Oral  PainSc: 7    Pain Goal:                 Kaylyn LayerKathryn E Roselyne Stalnaker

## 2018-09-05 NOTE — Progress Notes (Signed)
RN called anesthesia to report worsening upper left back pain. Pt reports that it is not near the site of her spinal site and if she turns her head a certain way it hurts her back. Pt denies any headache, neck pain, or change in vision. Pt repositioned and given heating packs but still reports having the bad pain. Pt has not eaten anything yet, including ice chips, because she describes the pain as being "too bad to eat anything". Pt also reports past experience of becoming queasy when taking medications on an empty stomach. RN called Dr. Stephannie Peters to report the patient's condition but was advised to call back because she was in the middle of doing an epidural. RN called Dr. Stephannie Peters back to report the patient's condition and RN was advised to give patient crackers to eat and give PO Tylenol 1,000 mg that is ordered. If unable to tolerate, RN advised that patient can receive IV Tylenol. Dr. Stephannie Peters stated if patient's pain continues that it is okay if patient is given 1 Oxy before the 12 hour passes after receiving the Duramorph. RN acknowledged Dr. Susy Frizzle verbal orders and will proceed with care of the patient.

## 2018-09-06 LAB — CBC
HCT: 27.8 % — ABNORMAL LOW (ref 36.0–46.0)
Hemoglobin: 8.9 g/dL — ABNORMAL LOW (ref 12.0–15.0)
MCH: 23.1 pg — ABNORMAL LOW (ref 26.0–34.0)
MCHC: 32 g/dL (ref 30.0–36.0)
MCV: 72 fL — ABNORMAL LOW (ref 80.0–100.0)
NRBC: 0 % (ref 0.0–0.2)
PLATELETS: 232 10*3/uL (ref 150–400)
RBC: 3.86 MIL/uL — ABNORMAL LOW (ref 3.87–5.11)
RDW: 19.2 % — AB (ref 11.5–15.5)
WBC: 19.6 10*3/uL — AB (ref 4.0–10.5)

## 2018-09-06 LAB — CREATININE, SERUM: Creatinine, Ser: 0.65 mg/dL (ref 0.44–1.00)

## 2018-09-06 MED ORDER — ONDANSETRON 8 MG PO TBDP
8.0000 mg | ORAL_TABLET | Freq: Three times a day (TID) | ORAL | Status: DC | PRN
  Administered 2018-09-06: 8 mg via ORAL
  Filled 2018-09-06 (×2): qty 1

## 2018-09-06 MED ORDER — ONDANSETRON HCL 4 MG PO TABS
8.0000 mg | ORAL_TABLET | Freq: Three times a day (TID) | ORAL | Status: DC | PRN
  Administered 2018-09-07 – 2018-09-08 (×2): 8 mg via ORAL
  Filled 2018-09-06 (×2): qty 2

## 2018-09-06 NOTE — Lactation Note (Signed)
This note was copied from a baby's chart. Lactation Consultation Note  Patient Name: Shannon Delgado ZOXWR'UToday's Date: 09/06/2018 Reason for consult: Follow-up assessment;Difficult latch;Term  Visited with P2 Mom of term baby at 6228 hrs old.  Baby has been sleepy and latching but not sustaining the latch more than a few sucks.  Baby at 4% weight loss this am.  Baby cueing and offered to assist with positioning and latching baby.  Baby noted to be a little jittery in her crib.  Baby placed STS in football hold on right breast.  Baby opened widely, while sandwiching breast brought baby to latch.  Baby unable to attain a deep latch to the breast.  Attempts made several times.    Initiated a nipple shield (24 mm) and instructed on use and how to apply to breast.  Nipple pulled well into shield.  Baby continues to tongue thrust and push the shield out of her mouth.  Talked about the need to supplement with formula.  Mom had double pumped 3 times already, and not obtained any colostrum at present.  Initiated a 5 fr feeding tube and syringe in corner of baby's mouth.  Baby able to draw 7 ml of formula herself, before she fell asleep.  During the supplementation, baby able to maintain a deeper latch, while Mom firmly supporting her breast, at the base.  Mom stated she felt a pull on her breast.    Demonstrated how to suck train using feeding tube and syringe with finger, if Mom would like to solely pump at a feeding.  Assisted with double pumping, instructed Mom and FOB on importance of disassembling pump parts to wash, rinse and set in bin to dry.    Feeding plan- 1-Keep baby STS as much as possible 2- Offer breast when baby cues she is hungry.  Pre-pump to pull nipple out, and use 24 mm nipple shield with 10-15 ml of formula+/EBM in feeding tube 3- Pump both breasts 15 mins on initiation setting. 4- Ask for help prn. 5- Suck train prn     Interventions Interventions: Breast feeding basics  reviewed;Assisted with latch;Skin to skin;Breast massage;Hand express;Pre-pump if needed;Breast compression;Adjust position;Support pillows;Position options;Expressed milk;DEBP;Shells  Lactation Tools Discussed/Used Tools: Pump;Shells;Nipple Shields;86F feeding tube / Syringe Nipple shield size: 24 Shell Type: Inverted Breast pump type: Double-Electric Breast Pump   Consult Status Consult Status: Follow-up Date: 09/07/18 Follow-up type: In-patient    Judee ClaraSmith, Shannon Delgado 09/06/2018, 3:18 PM

## 2018-09-06 NOTE — Anesthesia Postprocedure Evaluation (Signed)
Anesthesia Post Note  Patient: Shannon Delgado  Procedure(s) Performed: CESAREAN SECTION (N/A )     Patient location during evaluation: Mother Baby Anesthesia Type: Spinal Level of consciousness: awake and alert Pain management: pain level controlled Vital Signs Assessment: post-procedure vital signs reviewed and stable Respiratory status: spontaneous breathing, nonlabored ventilation and respiratory function stable Cardiovascular status: stable Postop Assessment: no headache, no backache, spinal receding, patient able to bend at knees, adequate PO intake, able to ambulate and no apparent nausea or vomiting Anesthetic complications: no    Last Vitals:  Vitals:   09/06/18 0050 09/06/18 0830  BP: 110/85 105/65  Pulse: 80 70  Resp:  16  Temp: 36.8 C 36.7 C  SpO2:      Last Pain:  Vitals:   09/06/18 0830  TempSrc: Oral  PainSc:    Pain Goal: Patients Stated Pain Goal: 2 (09/06/18 0242)               Laban EmperorMalinova,Breyton Vanscyoc Hristova

## 2018-09-06 NOTE — Progress Notes (Signed)
POSTPARTUM PROGRESS NOTE  POD #1  Subjective:  Denyse Amassicole Leigh Whitney Granito is a 32 y.o. G2P2002 s/p rLTCS at 2053w0d.  She reports she doing well. No acute events overnight. She reports she is doing well. Reports left sided back pain, thinks it is 2/2 position during c-section. Pain is exacerbated when using left arm. She denies any problems with ambulating, voiding or po intake. Denies nausea or vomiting, had all day yesterday but improved Reports gas but no BM. She has passed flatus. Pain is well controlled.  Lochia is light.  Objective: Blood pressure 110/85, pulse 80, temperature 98.2 F (36.8 C), temperature source Oral, resp. rate 17, height 5\' 2"  (1.575 m), weight 70.6 kg, last menstrual period 12/06/2017, SpO2 98 %, unknown if currently breastfeeding.  Physical Exam:  General: alert, cooperative and no distress Chest: no respiratory distress Heart:regular rate, distal pulses intact Abdomen: soft, nontender,  Uterine Fundus: firm, appropriately tender DVT Evaluation: No calf swelling or tenderness Extremities: no edema Skin: warm, dry; incision clean/dry/intact w/ honeycomb dressing in place  Recent Labs    09/04/18 1042 09/06/18 0513  HGB 9.6* 8.9*  HCT 30.2* 27.8*    Assessment/Plan: Denyse Amassicole Leigh Whitney Papesh is a 32 y.o. [id number] s/p rLTCS at 5253w0d for elective repeat.  POD#1 - Doing welll; pain well controlled. H/H appropriate (9.6>8.9)  Routine postpartum care  OOB, ambulated  SCD for VTE prophylaxis  Anemia: asymptomatic  Contraception: declines  Feeding: breast   Dispo: Plan for discharge 11/15.   LOS: 1 day   Oralia ManisSherin Ofilia Rayon, DO PGY-2 09/06/2018, 7:48 AM

## 2018-09-06 NOTE — Addendum Note (Signed)
Addendum  created 09/06/18 0953 by Elgie CongoMalinova, Haleemah Buckalew H, CRNA   Sign clinical note

## 2018-09-06 NOTE — Progress Notes (Signed)
MOB was referred for history of depression/anxiety. * Referral screened out by Clinical Social Worker because none of the following criteria appear to apply: ~ History of anxiety/depression during this pregnancy, or of post-partum depression following prior delivery.Per chart review of OB records, MOB did not exhibit a hx of anxiety during this pregnancy. Per chart review, MOB has fear of examinations and did not demonstrate any generalized anxiety.  ~ Diagnosis of anxiety and/or depression within last 3 years OR * MOB's symptoms currently being treated with medication and/or therapy. Please contact the Clinical Social Worker if needs arise, by MOB request, or if MOB scores greater than 9/yes to question 10 on Edinburgh Postpartum Depression Screen.  CSW spoke with bedside RN who reported no concerns about MOB.    Korben Carcione, LCSWA Clinical Social Worker [facility] Cell#: [phone]  

## 2018-09-07 ENCOUNTER — Other Ambulatory Visit: Payer: Self-pay

## 2018-09-07 MED ORDER — VITAMIN B-6 50 MG PO TABS
50.0000 mg | ORAL_TABLET | Freq: Every evening | ORAL | Status: DC | PRN
  Administered 2018-09-07 (×2): 50 mg via ORAL
  Filled 2018-09-07 (×3): qty 1

## 2018-09-07 MED ORDER — DOXYLAMINE SUCCINATE (SLEEP) 25 MG PO TABS
25.0000 mg | ORAL_TABLET | Freq: Every evening | ORAL | Status: DC | PRN
  Administered 2018-09-07: 25 mg via ORAL
  Filled 2018-09-07 (×2): qty 1

## 2018-09-07 NOTE — Progress Notes (Signed)
POSTPARTUM PROGRESS NOTE  POD #2  Subjective:  Shannon Delgado is a 32 y.o. G2P20[name] s/p rLTCS at [facility].  She reports she doing well. No acute events overnight. She reports she is doing well. She denies any problems with ambulating, voiding or po intake. Denies nausea or vomiting. She has has passed flatus but denies BM. Pain is well controlled.  Lochia is mild.  Objective: Blood pressure (!) 92/52, pulse 78, temperature 98 F (36.7 C), temperature source Oral, resp. rate 16, height 5\' 2"  (1.575 m), weight 70.6 kg, last menstrual period 12/06/2017, SpO2 99 %, unknown if currently breastfeeding.  Physical Exam:  General: alert, cooperative and no distress Chest: no respiratory distress Heart:regular rate, distal pulses intact Abdomen: soft, nontender,  Uterine Fundus: firm, appropriately tender DVT Evaluation: No calf swelling or tenderness Extremities: no edema Skin: warm, dry; incision clean/dry/intact w/ honeycomb dressing in place  Recent Labs    09/04/18 1042 09/06/18 0513  HGB 9.6* 8.9*  HCT 30.2* 27.8*    Assessment/Plan: Shannon Delgado is a 32 y.o. [id number] s/p rLTCS at 6548w0d for elective repeat.  POD#2 - Doing welll; pain well controlled. H/H appropriate  Routine postpartum care  OOB, ambulated  SCD and ambulation for VTE prophylaxis  Anemia: asymptomatic    Contraception: POPs Feeding: breast  Dispo: Plan for discharge 09/08/18.   LOS: 2 days   Shannon Delgado, DOI PGY-2 09/07/2018, 7:57 AM

## 2018-09-08 MED ORDER — IBUPROFEN 600 MG PO TABS: 600.0000 mg | ORAL_TABLET | Freq: Four times a day (QID) | ORAL | 0 refills | Status: AC

## 2018-09-08 MED ORDER — NORETHINDRONE 0.35 MG PO TABS: 1.0000 | ORAL_TABLET | Freq: Every day | ORAL | 11 refills | Status: DC

## 2018-09-08 MED ORDER — OXYCODONE HCL 5 MG PO TABS: 5.0000 mg | ORAL_TABLET | ORAL | 0 refills | Status: AC | PRN

## 2018-09-08 NOTE — Lactation Note (Signed)
This note was copied from a baby's chart. Lactation Consultation Note  Patient Name: Shannon Delgado [id number]'s Date: 09/08/2018 Reason for consult: Follow-up assessment;Term  P2 mother whose infant is now 5973 hours old.  This is a term infant at 39 weeks.  Mother's breasts are full and milk has "come to volume."  Mother stated this just happened overnight.  She had not put baby to breast since yesterday at 1430.  She has been using her DEBP.  Her nipples are flat and slightly inverted.  She has a small blister on the left nipple.   I encouraged feeding 8-12 times/24 hours or sooner if baby shows feeding cues.  Baby should be put to breast now to help relieve fullness.  I suggested feeding before discharge.  Mother has also experienced engorgement with her first child stating the hospital would not allow her to pump.  Reviewed engorgement prevention/treatment.  Discussed comfort measures to help relieve fullness.  Comfort gels provided with instructions for use.  Educated on using EBM and coconut oil for relief but not using coconut oil with gels.  Mother verbalized understanding.  Mother has a manual pump and father will be ordering a DEBP for home use.  RN updated.  Mother has our OP phone number for questions after discharge.         Maternal Data Formula Feeding for Exclusion: No Has patient been taught Hand Expression?: Yes Does the patient have breastfeeding experience prior to this delivery?: Yes  Feeding    LATCH Score                   Interventions    Lactation Tools Discussed/Used WIC Program: No Initiated by:: Already initiated   Consult Status Consult Status: Complete Date: 09/08/18 Follow-up type: Call as needed    Shannon Delgado 09/08/2018, 12:40 PM

## 2018-09-08 NOTE — Discharge Summary (Signed)
OB Discharge Summary     Patient Name: Shannon Delgado DOB: 09/06/1986 MRN: [id number]  Date of admission: 09/05/2018 Delivering MD: Levie HeritageSTINSON, JACOB J   Date of discharge: 09/08/2018  Admitting diagnosis: RCS Intrauterine pregnancy: 6380w0d     Secondary diagnosis:  Active Problems:   Status post repeat low transverse cesarean section  Additional problems:      Discharge diagnosis: Term Pregnancy Delivered                                                                                                Post partum procedures:  Augmentation:   Complications: None  Hospital course:  Sceduled C/S   32 y.o. yo G2P2002 at 1580w0d was admitted to the hospital 09/05/2018 for scheduled cesarean section with the following indication:Elective Repeat.  Membrane Rupture Time/Date: 10:47 AM ,09/05/2018   Patient delivered a Viable infant.09/05/2018  Details of operation can be found in separate operative note.  Pateint had an uncomplicated postpartum course.  She is ambulating, tolerating a regular diet, passing flatus, and urinating well. Patient is discharged home in stable condition on  09/08/18         Physical exam  Vitals:   09/07/18 1100 09/07/18 1335 09/07/18 2217 09/08/18 0535  BP: 104/65 116/68 115/66 107/70  Pulse: 60 85 79 75  Resp: 16 16 18 18   Temp: 98.3 F (36.8 C) 98.4 F (36.9 C) 98.4 F (36.9 C) 98.6 F (37 C)  TempSrc: Oral Oral Oral Oral  SpO2:      Weight:      Height:       General: alert, cooperative and no distress Lochia: appropriate Uterine Fundus: firm Incision: Healing well with no significant drainage, No significant erythema, Dressing is clean, dry, and intact DVT Evaluation: No evidence of DVT seen on physical exam. Negative Homan's sign. No cords or calf tenderness. Labs: Lab Results  Component Value Date   WBC 19.6 (H) 09/06/2018   HGB 8.9 (L) 09/06/2018   HCT 27.8 (L) 09/06/2018   MCV 72.0 (L) 09/06/2018   PLT 232 09/06/2018    CMP Latest Ref Rng & Units 09/06/2018  Glucose 65 - 99 mg/dL -  BUN 6 - 20 mg/dL -  Creatinine 1.020.44 - 7.251.00 mg/dL 3.660.65  Sodium 440134 - 347144 mmol/L -  Potassium 3.5 - 5.2 mmol/L -  Chloride 96 - 106 mmol/L -  CO2 20 - 29 mmol/L -  Calcium 8.7 - 10.2 mg/dL -  Total Protein 6.0 - 8.5 g/dL -  Total Bilirubin 0.0 - 1.2 mg/dL -  Alkaline Phos 39 - 425117 IU/L -  AST 0 - 40 IU/L -  ALT 0 - 32 IU/L -    Discharge instruction: per After Visit Summary and "Baby and Me Booklet".  After visit meds:  Allergies as of 09/08/2018   No Known Allergies     Medication List    STOP taking these medications   [employer] PRENATAL GUMMY PO   doxylamine (Sleep) 25 MG tablet Commonly known as:  UNISOM   ondansetron 4 MG tablet Commonly known as:  ZOFRAN  TAKE these medications   FUSION PLUS Caps Take 1 capsule by mouth daily before breakfast. What changed:  when to take this   ibuprofen 600 MG tablet Commonly known as:  ADVIL,MOTRIN Take 1 tablet (600 mg total) by mouth every 6 (six) hours.   norethindrone 0.35 MG tablet Commonly known as:  MICRONOR,CAMILA,ERRIN Take 1 tablet (0.35 mg total) by mouth daily.   oxyCODONE 5 MG immediate release tablet Commonly known as:  Oxy IR/ROXICODONE Take 1 tablet (5 mg total) by mouth every 4 (four) hours as needed (pain scale 4-7).       Diet: routine diet  Activity: Advance as tolerated. Pelvic rest for 6 weeks.   Outpatient follow up: Follow up Appt: Future Appointments  Date Time Provider Department Center  09/20/2018  9:30 AM [facility] NURSE [facility] None  10/05/2018  9:00 AM Conan Bowens, MD [facility] None   Follow up Visit:No follow-ups on file.  Postpartum contraception: Progesterone only pills  Newborn Data: Live born female  Birth Weight: 7 lb 4.4 oz (3300 g) APGAR: 9, 9  Newborn Delivery   Birth date/time:  09/05/2018 10:49:00 Delivery type:  C-Section, Low Transverse Trial of labor:  No C-section categorization:  Repeat      Baby Feeding: Breast Disposition:home with mother   09/08/2018 Jacklyn Shell, CNM

## 2018-09-08 NOTE — Discharge Instructions (Signed)

## 2018-09-20 ENCOUNTER — Ambulatory Visit: Payer: Managed Care, Other (non HMO)

## 2018-09-20 VITALS — BP 106/72 | HR 112 | Wt 137.7 lb

## 2018-09-20 DIAGNOSIS — Z5189 Encounter for other specified aftercare: Secondary | ICD-10-CM

## 2018-09-20 NOTE — Progress Notes (Signed)
Pt is post op 15 days C section. Pts honeycomb dressing is still on, peeling up on the edges- I advised pt to go ahead and take that off in the next couple days but leave the steristrips on until they fall off. No signs of infection, no drainage or bleeding. Pt states that pain is manageable with ibuprofen. I advised patient to call with any concerns or questions. PP appt set for 10/05/18.

## 2018-10-05 ENCOUNTER — Ambulatory Visit: Payer: Managed Care, Other (non HMO) | Admitting: Obstetrics

## 2018-12-31 ENCOUNTER — Emergency Department (HOSPITAL_BASED_OUTPATIENT_CLINIC_OR_DEPARTMENT_OTHER)
Admission: EM | Admit: 2018-12-31 | Discharge: 2018-12-31 | Disposition: A | Discharge status: Home / Self Care | Payer: BLUE CROSS/BLUE SHIELD | Attending: Emergency Medicine | Admitting: Emergency Medicine

## 2018-12-31 ENCOUNTER — Other Ambulatory Visit: Payer: Self-pay

## 2018-12-31 ENCOUNTER — Encounter (HOSPITAL_BASED_OUTPATIENT_CLINIC_OR_DEPARTMENT_OTHER): Payer: Self-pay | Admitting: *Deleted

## 2018-12-31 DIAGNOSIS — T782XXA Anaphylactic shock, unspecified, initial encounter: Secondary | ICD-10-CM

## 2018-12-31 DIAGNOSIS — Z79899 Other long term (current) drug therapy: Secondary | ICD-10-CM | POA: Insufficient documentation

## 2018-12-31 MED ORDER — FAMOTIDINE IN NACL 20-0.9 MG/50ML-% IV SOLN
INTRAVENOUS | Status: AC
  Filled 2018-12-31: qty 50

## 2018-12-31 MED ORDER — EPINEPHRINE 0.3 MG/0.3ML IJ SOAJ
0.3000 mg | Freq: Once | INTRAMUSCULAR | Status: AC
  Administered 2018-12-31: 0.3 mg via INTRAMUSCULAR

## 2018-12-31 MED ORDER — DIPHENHYDRAMINE HCL 50 MG/ML IJ SOLN
INTRAMUSCULAR | Status: AC
  Filled 2018-12-31: qty 1

## 2018-12-31 MED ORDER — METHYLPREDNISOLONE SODIUM SUCC 125 MG IJ SOLR
INTRAMUSCULAR | Status: AC
  Filled 2018-12-31: qty 2

## 2018-12-31 MED ORDER — EPINEPHRINE 0.3 MG/0.3ML IJ SOAJ: 0.3000 mg | INTRAMUSCULAR | 0 refills | Status: AC | PRN

## 2018-12-31 MED ORDER — DIPHENHYDRAMINE HCL 50 MG/ML IJ SOLN
25.0000 mg | Freq: Once | INTRAMUSCULAR | Status: AC
  Administered 2018-12-31: 25 mg via INTRAVENOUS

## 2018-12-31 MED ORDER — EPINEPHRINE 0.3 MG/0.3ML IJ SOAJ
INTRAMUSCULAR | Status: AC
  Filled 2018-12-31: qty 0.3

## 2018-12-31 MED ORDER — METHYLPREDNISOLONE SODIUM SUCC 125 MG IJ SOLR
125.0000 mg | Freq: Once | INTRAMUSCULAR | Status: AC
  Administered 2018-12-31: 125 mg via INTRAVENOUS

## 2018-12-31 NOTE — ED Notes (Signed)
ED Provider at bedside. 

## 2018-12-31 NOTE — ED Triage Notes (Addendum)
Facial swelling and hives x 30 min. Pt took 1 benadryl on the way here

## 2018-12-31 NOTE — ED Provider Notes (Signed)
[facility] EMERGENCY DEPARTMENT Provider Note   CSN: [id number] Arrival date & time: 12/31/18  1836    History   Chief Complaint Chief Complaint  Patient presents with  . Allergic Reaction    HPI Shannon Delgado is a 33 y.o. female.     The history is provided by the patient. No language interpreter was used.  Allergic Reaction   Shannon Delgado is a 33 y.o. female who presents to the Emergency Department complaining of allergic reaction. Presents to the emergency department for possible allergic reaction. She developed an itchy rash to her face and upper body that started about 30 minutes prior to ED arrival. She did eat leftover [place] food today. She ate the food last night without any difficulties. She denies any difficulty breathing, nausea, vomiting. She did take one Benadryl prior to ED arrival. She does have a history of CAD allergy but no additional allergies. No prior similar symptoms. She is three months postpartum and currently breast-feeding. Past Medical History:  Diagnosis Date  . Anxiety   . Headache     Patient Active Problem List   Diagnosis Date Noted  . Status post repeat low transverse cesarean section 09/05/2018  . Supervision of normal pregnancy, antepartum 02/24/2018  . History of cesarean delivery, currently pregnant 02/24/2018  . Fear of examinations 02/24/2018    Past Surgical History:  Procedure Laterality Date  . CESAREAN SECTION    . CESAREAN SECTION N/A 09/05/2018   Procedure: CESAREAN SECTION;  Surgeon: Levie Heritage, DO;  Location: [facility] BIRTHING SUITES;  Service: Obstetrics;  Laterality: N/A;  . KNEE SURGERY Right      OB History    Gravida  2   Para  2   Term  2   Preterm      AB      Living  2     SAB      TAB      Ectopic      Multiple  0   Live Births  2        Obstetric Comments  Primary LTCS due to anxiety about pelvic/vaginal examination         Home  Medications    Prior to Admission medications   Medication Sig Start Date End Date Taking? Authorizing Provider  EPINEPHrine 0.3 mg/0.3 mL IJ SOAJ injection Inject 0.3 mLs (0.3 mg total) into the muscle as needed for anaphylaxis. 12/31/18   Tilden Fossa, MD  ibuprofen (ADVIL,MOTRIN) 600 MG tablet Take 1 tablet (600 mg total) by mouth every 6 (six) hours. 09/08/18   Cresenzo-Dishmon, Scarlette Calico, CNM  Iron-FA-B Cmp-C-Biot-Probiotic (FUSION PLUS) CAPS Take 1 capsule by mouth daily before breakfast. Patient taking differently: Take 1 capsule by mouth every evening.  08/15/18   Brock Bad, MD  norethindrone (ORTHO MICRONOR) 0.35 MG tablet Take 1 tablet (0.35 mg total) by mouth daily. 09/08/18   Cresenzo-Dishmon, Scarlette Calico, CNM  oxyCODONE (OXY IR/ROXICODONE) 5 MG immediate release tablet Take 1 tablet (5 mg total) by mouth every 4 (four) hours as needed (pain scale 4-7). 09/08/18   Jacklyn Shell, CNM    Family History Family History  Problem Relation Age of Onset  . Breast cancer Paternal Aunt 76  . Diabetes Paternal Aunt   . Diabetes Father   . Hypertension Father   . Anxiety disorder Sister   . Depression Sister   . Cancer Maternal Aunt   . Diabetes Paternal Grandfather   . Asthma Other  Social History Social History   Tobacco Use  . Smoking status: Never Smoker  . Smokeless tobacco: Never Used  Substance Use Topics  . Alcohol use: Not Currently  . Drug use: Never     Allergies   Aspirin   Review of Systems Review of Systems  All other systems reviewed and are negative.    Physical Exam Updated Vital Signs BP (!) 120/51   Pulse 87   Temp 98.3 F (36.8 C) (Oral)   Resp 18   Ht 5\' 3"  (1.6 m)   Wt 68 kg   SpO2 100%   Breastfeeding Yes   BMI 26.57 kg/m   Physical Exam Vitals signs and nursing note reviewed.  Constitutional:      Appearance: She is well-developed.  HENT:     Head: Normocephalic and atraumatic.  Cardiovascular:     Rate and  Rhythm: Regular rhythm.     Heart sounds: No murmur.     Comments: tachycardic Pulmonary:     Effort: Pulmonary effort is normal. No respiratory distress.     Breath sounds: Normal breath sounds.  Abdominal:     Palpations: Abdomen is soft.     Tenderness: There is no abdominal tenderness. There is no guarding or rebound.  Musculoskeletal:        General: No tenderness.  Skin:    General: Skin is warm and dry.     Comments: Diffuse urticaria, Ms. significant on face and bilateral upper extremities. There is significant mucosal edema of the lips. No tongue or posterior oropharyngeal edema  Neurological:     Mental Status: She is alert and oriented to person, place, and time.  Psychiatric:        Behavior: Behavior normal.     Comments: Anxious appearing      ED Treatments / Results  Labs (all labs ordered are listed, but only abnormal results are displayed) Labs Reviewed - No data to display  EKG None  Radiology No results found.  Procedures Procedures (including critical care time) CRITICAL CARE Performed by: Tilden Fossa   Total critical care time: 35 minutes  Critical care time was exclusive of separately billable procedures and treating other patients.  Critical care was necessary to treat or prevent imminent or life-threatening deterioration.  Critical care was time spent personally by me on the following activities: development of treatment plan with patient and/or surrogate as well as nursing, discussions with consultants, evaluation of patient's response to treatment, examination of patient, obtaining history from patient or surrogate, ordering and performing treatments and interventions, ordering and review of laboratory studies, ordering and review of radiographic studies, pulse oximetry and re-evaluation of patient's condition.  Medications Ordered in ED Medications  EPINEPHrine (EPI-PEN) injection 0.3 mg (0.3 mg Intramuscular Given 12/31/18 1851)   methylPREDNISolone sodium succinate (SOLU-MEDROL) 125 mg/2 mL injection 125 mg (125 mg Intravenous Given 12/31/18 1851)  diphenhydrAMINE (BENADRYL) injection 25 mg (25 mg Intravenous Given 12/31/18 1851)     Initial Impression / Assessment and Plan / ED Course  I have reviewed the triage vital signs and the nursing notes.  Pertinent labs & imaging results that were available during my care of the patient were reviewed by me and considered in my medical decision making (see chart for details).        Patient presents to the emergency department for evaluation of facial and body swelling, itching. She has significant edema of her lips, Periorbital region as well as diffuse urticaria. She was treated with  epinephrine for anaphylaxis. She was also given a dose of steroids, Benadryl. She was observed in the emergency department for several hours with significant improvement in the resolution of her symptoms. On repeat assessment patient does recall taking dose of aspirin for headache earlier today around 2 PM. There is concerned that the aspirin was the inciting agent for this reaction. Discussed with patient avoiding all aspirin products. Discussed home care for anaphylaxis as well as close return precautions.  Final Clinical Impressions(s) / ED Diagnoses   Final diagnoses:  Anaphylaxis, initial encounter    ED Discharge Orders         Ordered    EPINEPHrine 0.3 mg/0.3 mL IJ SOAJ injection  As needed     12/31/18 2211           Tilden Fossa, MD 12/31/18 2342

## 2018-12-31 NOTE — Discharge Instructions (Addendum)
Do not take aspirin or aspirin containing products. You can take Benadryl, available over-the-counter if you develop itching or rash. Use your EpiPen if you develop difficulty breathing, swelling in your throat, lightheadedness.

## 2019-09-29 ENCOUNTER — Other Ambulatory Visit: Payer: Self-pay | Admitting: Advanced Practice Midwife

## 2020-01-27 ENCOUNTER — Other Ambulatory Visit: Payer: Self-pay

## 2020-01-27 ENCOUNTER — Emergency Department (HOSPITAL_BASED_OUTPATIENT_CLINIC_OR_DEPARTMENT_OTHER)
Admission: EM | Admit: 2020-01-27 | Discharge: 2020-01-28 | Disposition: A | Discharge status: Home / Self Care | Payer: BC Managed Care – PPO | Attending: Emergency Medicine | Admitting: Emergency Medicine

## 2020-01-27 ENCOUNTER — Encounter (HOSPITAL_BASED_OUTPATIENT_CLINIC_OR_DEPARTMENT_OTHER): Payer: Self-pay | Admitting: Emergency Medicine

## 2020-01-27 DIAGNOSIS — L509 Urticaria, unspecified: Secondary | ICD-10-CM | POA: Insufficient documentation

## 2020-01-27 DIAGNOSIS — Z886 Allergy status to analgesic agent status: Secondary | ICD-10-CM | POA: Diagnosis not present

## 2020-01-27 DIAGNOSIS — T7840XA Allergy, unspecified, initial encounter: Secondary | ICD-10-CM | POA: Diagnosis not present

## 2020-01-27 MED ORDER — PREDNISONE 50 MG PO TABS
60.0000 mg | ORAL_TABLET | Freq: Once | ORAL | Status: AC
  Administered 2020-01-27: 60 mg via ORAL
  Filled 2020-01-27: qty 1

## 2020-01-27 MED ORDER — FAMOTIDINE 20 MG PO TABS
20.0000 mg | ORAL_TABLET | Freq: Once | ORAL | Status: AC
  Administered 2020-01-27: 20 mg via ORAL
  Filled 2020-01-27: qty 1

## 2020-01-27 NOTE — ED Notes (Signed)
ED Provider at bedside. 

## 2020-01-27 NOTE — ED Triage Notes (Signed)
Pt was having dinner at home and noticed itchiness ~15 min after having shrimp. Pt noted hives to axilla, neck and face. Hives noted to BIL arms. Denies throat itching or SHOB. Pt took 50 mg of Benadryl at 2130. VSS, NAD.

## 2020-01-28 MED ORDER — PREDNISONE 20 MG PO TABS: 40.0000 mg | ORAL_TABLET | Freq: Every day | ORAL | 0 refills | Status: AC

## 2020-01-28 MED ORDER — FAMOTIDINE 20 MG PO TABS: 20.0000 mg | ORAL_TABLET | Freq: Every day | ORAL | 0 refills | Status: AC

## 2020-01-28 MED ORDER — DIPHENHYDRAMINE HCL 25 MG PO TABS: 25.0000 mg | ORAL_TABLET | Freq: Four times a day (QID) | ORAL | 0 refills | Status: AC | PRN

## 2020-01-28 NOTE — ED Provider Notes (Signed)
[facility] EMERGENCY DEPARTMENT Provider Note   CSN: [id number] Arrival date & time: 01/27/20  2234     History Chief Complaint  Patient presents with  . Allergic Reaction    Shannon Delgado is a 34 y.o. female.  HPI     This a 34 year old female with no significant past medical history who presents with an allergic reaction.  Patient reports that she ate dinner early this evening with some shrimp.  She began to have some itchiness and noted hives over her face and her axilla.  She took 50 mg of Benadryl.  She did not have any shortness of breath, throat swelling, nausea, vomiting.  No known history of shellfish allergy.  She does not believe she could have reacted to anything else.  Of note, she did recently have an anaphylactic reaction to aspirin.  She has not seen and allergist.  She states she has significant improvement of her itching with Benadryl.  She took Benadryl at 2130.  Past Medical History:  Diagnosis Date  . Anxiety   . Headache     Patient Active Problem List   Diagnosis Date Noted  . Status post repeat low transverse cesarean section 09/05/2018  . Supervision of normal pregnancy, antepartum 02/24/2018  . History of cesarean delivery, currently pregnant 02/24/2018  . Fear of examinations 02/24/2018    Past Surgical History:  Procedure Laterality Date  . CESAREAN SECTION    . CESAREAN SECTION N/A 09/05/2018   Procedure: CESAREAN SECTION;  Surgeon: Levie Heritage, DO;  Location: [facility] BIRTHING SUITES;  Service: Obstetrics;  Laterality: N/A;  . KNEE SURGERY Right      OB History    Gravida  2   Para  2   Term  2   Preterm      AB      Living  2     SAB      TAB      Ectopic      Multiple  0   Live Births  2        Obstetric Comments  Primary LTCS due to anxiety about pelvic/vaginal examination        Family History  Problem Relation Age of Onset  . Breast cancer Paternal Aunt 22  . Diabetes Paternal  Aunt   . Diabetes Father   . Hypertension Father   . Anxiety disorder Sister   . Depression Sister   . Cancer Maternal Aunt   . Diabetes Paternal Grandfather   . Asthma Other     Social History   Tobacco Use  . Smoking status: Never Smoker  . Smokeless tobacco: Never Used  Substance Use Topics  . Alcohol use: Not Currently  . Drug use: Never    Home Medications Prior to Admission medications   Medication Sig Start Date End Date Taking? Authorizing Provider  diphenhydrAMINE (BENADRYL) 25 MG tablet Take 1 tablet (25 mg total) by mouth every 6 (six) hours as needed. 01/28/20   Auden Wettstein, Mayer Masker, MD  EPINEPHrine 0.3 mg/0.3 mL IJ SOAJ injection Inject 0.3 mLs (0.3 mg total) into the muscle as needed for anaphylaxis. 12/31/18   Tilden Fossa, MD  famotidine (PEPCID) 20 MG tablet Take 1 tablet (20 mg total) by mouth daily. 01/28/20   Jesiah Grismer, Mayer Masker, MD  ibuprofen (ADVIL,MOTRIN) 600 MG tablet Take 1 tablet (600 mg total) by mouth every 6 (six) hours. 09/08/18   Cresenzo-Dishmon, Scarlette Calico, CNM  Iron-FA-B Cmp-C-Biot-Probiotic (FUSION PLUS) CAPS Take  1 capsule by mouth daily before breakfast. Patient taking differently: Take 1 capsule by mouth every evening.  08/15/18   Shelly Bombard, MD  norethindrone (MICRONOR) 0.35 MG tablet TAKE 1 TABLET BY MOUTH EVERY DAY 10/03/19   Cresenzo-Dishmon, Joaquim Lai, CNM  oxyCODONE (OXY IR/ROXICODONE) 5 MG immediate release tablet Take 1 tablet (5 mg total) by mouth every 4 (four) hours as needed (pain scale 4-7). 09/08/18   Cresenzo-Dishmon, Joaquim Lai, CNM  predniSONE (DELTASONE) 20 MG tablet Take 2 tablets (40 mg total) by mouth daily. 01/28/20   Shivani Barrantes, Barbette Hair, MD    Allergies    Aspirin and Shrimp [shellfish allergy]  Review of Systems   Review of Systems  Constitutional: Negative for fever.  HENT: Negative for trouble swallowing.   Respiratory: Negative for shortness of breath.   Gastrointestinal: Negative for abdominal pain, nausea and vomiting.    Skin: Positive for rash.  All other systems reviewed and are negative.   Physical Exam Updated Vital Signs BP 123/83 (BP Location: Right Arm)   Pulse 83   Resp (!) 21   Ht 1.575 m (5\' 2" )   Wt 56.5 kg   SpO2 100%   BMI 22.79 kg/m   Physical Exam Vitals and nursing note reviewed.  Constitutional:      Appearance: She is well-developed. She is not ill-appearing.  HENT:     Head: Normocephalic and atraumatic.     Nose: Nose normal.     Mouth/Throat:     Mouth: Mucous membranes are moist.     Comments: No obvious posterior oropharyngeal swelling Eyes:     Pupils: Pupils are equal, round, and reactive to light.  Cardiovascular:     Rate and Rhythm: Normal rate and regular rhythm.     Heart sounds: Normal heart sounds.  Pulmonary:     Effort: Pulmonary effort is normal. No respiratory distress.     Breath sounds: No wheezing.  Abdominal:     General: Bowel sounds are normal.     Palpations: Abdomen is soft.     Tenderness: There is no abdominal tenderness.  Musculoskeletal:     Right lower leg: No edema.     Left lower leg: No edema.  Skin:    General: Skin is warm and dry.     Comments: Urticaria noted over the neck and bilateral axilla  Neurological:     Mental Status: She is alert and oriented to person, place, and time.  Psychiatric:        Mood and Affect: Mood normal.     ED Results / Procedures / Treatments   Labs (all labs ordered are listed, but only abnormal results are displayed) Labs Reviewed - No data to display  EKG None  Radiology No results found.  Procedures Procedures (including critical care time)  Medications Ordered in ED Medications  famotidine (PEPCID) tablet 20 mg (20 mg Oral Given 01/27/20 2345)  predniSONE (DELTASONE) tablet 60 mg (60 mg Oral Given 01/27/20 2345)    ED Course  I have reviewed the triage vital signs and the nursing notes.  Pertinent labs & imaging results that were available during my care of the patient were  reviewed by me and considered in my medical decision making (see chart for details).    MDM Rules/Calculators/A&P                       Patient presents with allergic reaction.  On my evaluation, no signs or symptoms of  anaphylaxis and patient improved some with Benadryl although she does have persistent hives.  Patient was given prednisone and Pepcid.  She was observed in the emergency room for approximately 2 hours with defervesced  rash.  No progression of symptoms.  discussed with patient ongoing management at home with Pepcid, Benadryl, and prednisone daily for the next 5 days.  She has an EpiPen at home and knows the indication of use.  After history, exam, and medical workup I feel the patient has been appropriately medically screened and is safe for discharge home. Pertinent diagnoses were discussed with the patient. Patient was given return precautions.   Final Clinical Impression(s) / ED Diagnoses Final diagnoses:  Allergic reaction, initial encounter    Rx / DC Orders ED Discharge Orders         Ordered    predniSONE (DELTASONE) 20 MG tablet  Daily     01/28/20 0040    famotidine (PEPCID) 20 MG tablet  Daily     01/28/20 0040    diphenhydrAMINE (BENADRYL) 25 MG tablet  Every 6 hours PRN     01/28/20 0040           [place] Rosamond, Mayer Masker, MD 01/28/20 [phone]

## 2020-01-28 NOTE — Discharge Instructions (Addendum)
You were seen today for an allergic reaction.  This did not appear to be an anaphylactic reaction.  Take medications as prescribed.  If you develop recurrent symptoms or the onset of shortness of breath, throat swelling, any new or worsening symptoms you should be reevaluated.  Use your EpiPen for any life-threatening reaction.

## 2020-04-07 ENCOUNTER — Emergency Department (HOSPITAL_BASED_OUTPATIENT_CLINIC_OR_DEPARTMENT_OTHER): Payer: BC Managed Care – PPO

## 2020-04-07 ENCOUNTER — Emergency Department (HOSPITAL_BASED_OUTPATIENT_CLINIC_OR_DEPARTMENT_OTHER)
Admission: EM | Admit: 2020-04-07 | Discharge: 2020-04-07 | Disposition: A | Discharge status: Home / Self Care | Payer: BC Managed Care – PPO | Attending: Emergency Medicine | Admitting: Emergency Medicine

## 2020-04-07 ENCOUNTER — Other Ambulatory Visit: Payer: Self-pay

## 2020-04-07 ENCOUNTER — Encounter (HOSPITAL_BASED_OUTPATIENT_CLINIC_OR_DEPARTMENT_OTHER): Payer: Self-pay | Admitting: Emergency Medicine

## 2020-04-07 DIAGNOSIS — R109 Unspecified abdominal pain: Secondary | ICD-10-CM

## 2020-04-07 DIAGNOSIS — R1033 Periumbilical pain: Secondary | ICD-10-CM | POA: Diagnosis present

## 2020-04-07 DIAGNOSIS — R110 Nausea: Secondary | ICD-10-CM | POA: Diagnosis not present

## 2020-04-07 LAB — CBC WITH DIFFERENTIAL/PLATELET
Abs Immature Granulocytes: 0.03 10*3/uL (ref 0.00–0.07)
Basophils Absolute: 0 10*3/uL (ref 0.0–0.1)
Basophils Relative: 1 %
Eosinophils Absolute: 0.1 10*3/uL (ref 0.0–0.5)
Eosinophils Relative: 1 %
HCT: 35.9 % — ABNORMAL LOW (ref 36.0–46.0)
Hemoglobin: 11.6 g/dL — ABNORMAL LOW (ref 12.0–15.0)
Immature Granulocytes: 0 %
Lymphocytes Relative: 18 %
Lymphs Abs: 1.4 10*3/uL (ref 0.7–4.0)
MCH: 23.6 pg — ABNORMAL LOW (ref 26.0–34.0)
MCHC: 32.3 g/dL (ref 30.0–36.0)
MCV: 73.1 fL — ABNORMAL LOW (ref 80.0–100.0)
Monocytes Absolute: 0.4 10*3/uL (ref 0.1–1.0)
Monocytes Relative: 5 %
Neutro Abs: 5.8 10*3/uL (ref 1.7–7.7)
Neutrophils Relative %: 75 %
Platelets: 313 10*3/uL (ref 150–400)
RBC: 4.91 MIL/uL (ref 3.87–5.11)
RDW: 15.2 % (ref 11.5–15.5)
WBC: 7.7 10*3/uL (ref 4.0–10.5)
nRBC: 0 % (ref 0.0–0.2)

## 2020-04-07 LAB — COMPREHENSIVE METABOLIC PANEL
ALT: 13 U/L (ref 0–44)
AST: 14 U/L — ABNORMAL LOW (ref 15–41)
Albumin: 4.7 g/dL (ref 3.5–5.0)
Alkaline Phosphatase: 49 U/L (ref 38–126)
Anion gap: 8 (ref 5–15)
BUN: 14 mg/dL (ref 6–20)
CO2: 27 mmol/L (ref 22–32)
Calcium: 9.4 mg/dL (ref 8.9–10.3)
Chloride: 103 mmol/L (ref 98–111)
Creatinine, Ser: 0.72 mg/dL (ref 0.44–1.00)
GFR calc Af Amer: >60 mL/min (ref >60)
GFR calc non Af Amer: >60 mL/min (ref >60)
Glucose, Bld: 91 mg/dL (ref 70–99)
Potassium: 3.3 mmol/L — ABNORMAL LOW (ref 3.5–5.1)
Sodium: 138 mmol/L (ref 135–145)
Total Bilirubin: 1.2 mg/dL (ref 0.3–1.2)
Total Protein: 7.2 g/dL (ref 6.5–8.1)

## 2020-04-07 LAB — URINALYSIS, ROUTINE W REFLEX MICROSCOPIC
Bilirubin Urine: NEGATIVE
Glucose, UA: NEGATIVE mg/dL
Hgb urine dipstick: NEGATIVE
Ketones, ur: NEGATIVE mg/dL
Leukocytes,Ua: NEGATIVE
Nitrite: NEGATIVE
Protein, ur: NEGATIVE mg/dL
Specific Gravity, Urine: >1.03 — ABNORMAL HIGH (ref 1.005–1.030)
pH: 6 (ref 5.0–8.0)

## 2020-04-07 LAB — LIPASE, BLOOD: Lipase: 24 U/L (ref 11–51)

## 2020-04-07 LAB — PREGNANCY, URINE: Preg Test, Ur: NEGATIVE

## 2020-04-07 MED ORDER — SODIUM CHLORIDE 0.9 % IV SOLN: INTRAVENOUS | Status: DC | PRN

## 2020-04-07 MED ORDER — ONDANSETRON HCL 4 MG/2ML IJ SOLN
INTRAMUSCULAR | Status: AC
  Administered 2020-04-07: 4 mg via INTRAVENOUS
  Filled 2020-04-07: qty 2

## 2020-04-07 MED ORDER — FAMOTIDINE IN NACL 20-0.9 MG/50ML-% IV SOLN
20.0000 mg | Freq: Once | INTRAVENOUS | Status: AC
  Administered 2020-04-07: 20 mg via INTRAVENOUS
  Filled 2020-04-07: qty 50

## 2020-04-07 MED ORDER — ONDANSETRON HCL 4 MG/2ML IJ SOLN: 4.0000 mg | Freq: Once | INTRAMUSCULAR | Status: AC

## 2020-04-07 MED ORDER — MORPHINE SULFATE (PF) 4 MG/ML IV SOLN
4.0000 mg | Freq: Once | INTRAVENOUS | Status: AC
  Administered 2020-04-07: 4 mg via INTRAVENOUS
  Filled 2020-04-07: qty 1

## 2020-04-07 MED ORDER — ONDANSETRON HCL 4 MG PO TABS: 4.0000 mg | ORAL_TABLET | Freq: Four times a day (QID) | ORAL | 0 refills | Status: AC

## 2020-04-07 MED ORDER — PROMETHAZINE HCL 25 MG/ML IJ SOLN
12.5000 mg | Freq: Once | INTRAMUSCULAR | Status: AC
  Administered 2020-04-07: 12.5 mg via INTRAVENOUS
  Filled 2020-04-07: qty 1

## 2020-04-07 MED ORDER — IOHEXOL 300 MG/ML  SOLN
100.0000 mL | Freq: Once | INTRAMUSCULAR | Status: AC | PRN
  Administered 2020-04-07: 100 mL via INTRAVENOUS

## 2020-04-07 MED FILL — ONDANSETRON HCL 4 MG TABLET: 4 | 3 days supply | Qty: 12 | Fill #0

## 2020-04-07 NOTE — ED Triage Notes (Signed)
Periumbilical abd pain since midnight with nausea.  On the way to ED pt began having pain in her upper back and neck.

## 2020-04-11 NOTE — ED Provider Notes (Signed)
[facility] EMERGENCY DEPARTMENT Provider Note   CSN: [id number] Arrival date & time: 04/07/20  1062     History Chief Complaint  Patient presents with  . Abdominal Pain    Shannon Delgado Shannon Delgado is a 34 y.o. female.  HPI    34 year old female with abdominal pain.  Onset last night.  Went to sleep in her usual state of health.  She has had persistent pain throughout the early morning hours.  Pain is primarily around the mid abdomen.  Some radiation into her back.  No urinary complaints.  No unusual vaginal bleeding or discharge.  No fevers.  No sick contacts that she is aware of.  No vomiting or diarrhea but has been nauseated.  Past Medical History:  Diagnosis Date  . Anxiety   . Headache     Patient Active Problem List   Diagnosis Date Noted  . Status post repeat low transverse cesarean section 09/05/2018  . Supervision of normal pregnancy, antepartum 02/24/2018  . History of cesarean delivery, currently pregnant 02/24/2018  . Fear of examinations 02/24/2018    Past Surgical History:  Procedure Laterality Date  . CESAREAN SECTION    . CESAREAN SECTION N/A 09/05/2018   Procedure: CESAREAN SECTION;  Surgeon: Levie Heritage, DO;  Location: [facility] BIRTHING SUITES;  Service: Obstetrics;  Laterality: N/A;  . KNEE SURGERY Right      OB History    Gravida  2   Para  2   Term  2   Preterm      AB      Living  2     SAB      TAB      Ectopic      Multiple  0   Live Births  2        Obstetric Comments  Primary LTCS due to anxiety about pelvic/vaginal examination        Family History  Problem Relation Age of Onset  . Breast cancer Paternal Aunt 30  . Diabetes Paternal Aunt   . Diabetes Father   . Hypertension Father   . Anxiety disorder Sister   . Depression Sister   . Cancer Maternal Aunt   . Diabetes Paternal Grandfather   . Asthma Other     Social History   Tobacco Use  . Smoking status: Never Smoker  . Smokeless  tobacco: Never Used  Vaping Use  . Vaping Use: Never used  Substance Use Topics  . Alcohol use: Not Currently  . Drug use: Never    Home Medications Prior to Admission medications   Medication Sig Start Date End Date Taking? Authorizing Provider  diphenhydrAMINE (BENADRYL) 25 MG tablet Take 1 tablet (25 mg total) by mouth every 6 (six) hours as needed. 01/28/20   Horton, Mayer Masker, MD  EPINEPHrine 0.3 mg/0.3 mL IJ SOAJ injection Inject 0.3 mLs (0.3 mg total) into the muscle as needed for anaphylaxis. 12/31/18   Tilden Fossa, MD  famotidine (PEPCID) 20 MG tablet Take 1 tablet (20 mg total) by mouth daily. 01/28/20   Horton, Mayer Masker, MD  ibuprofen (ADVIL,MOTRIN) 600 MG tablet Take 1 tablet (600 mg total) by mouth every 6 (six) hours. 09/08/18   Cresenzo-Dishmon, Scarlette Calico, CNM  Iron-FA-B Cmp-C-Biot-Probiotic (FUSION PLUS) CAPS Take 1 capsule by mouth daily before breakfast. Patient taking differently: Take 1 capsule by mouth every evening.  08/15/18   Brock Bad, MD  norethindrone (MICRONOR) 0.35 MG tablet TAKE 1 TABLET BY MOUTH EVERY  DAY 10/03/19   Cresenzo-Dishmon, Joaquim Lai, CNM  ondansetron (ZOFRAN) 4 MG tablet Take 1 tablet (4 mg total) by mouth every 6 (six) hours. 04/07/20   Virgel Manifold, MD  oxyCODONE (OXY IR/ROXICODONE) 5 MG immediate release tablet Take 1 tablet (5 mg total) by mouth every 4 (four) hours as needed (pain scale 4-7). 09/08/18   Cresenzo-Dishmon, Joaquim Lai, CNM  predniSONE (DELTASONE) 20 MG tablet Take 2 tablets (40 mg total) by mouth daily. 01/28/20   Horton, Barbette Hair, MD    Allergies    Aspirin and Shrimp [shellfish allergy]  Review of Systems   Review of Systems All systems reviewed and negative, other than as noted in HPI.  Physical Exam Updated Vital Signs BP 112/84 (BP Location: Right Arm)   Pulse 77   Temp 98.8 F (37.1 C) (Oral)   Resp 16   Ht 5\' 3"  (1.6 m)   Wt 55.3 kg   SpO2 100%   BMI 21.61 kg/m   Physical Exam Vitals and nursing note  reviewed.  Constitutional:      General: She is not in acute distress.    Appearance: She is well-developed.  HENT:     Head: Normocephalic and atraumatic.  Eyes:     General:        Right eye: No discharge.        Left eye: No discharge.     Conjunctiva/sclera: Conjunctivae normal.  Cardiovascular:     Rate and Rhythm: Normal rate and regular rhythm.     Heart sounds: Normal heart sounds. No murmur heard.  No friction rub. No gallop.   Pulmonary:     Effort: Pulmonary effort is normal. No respiratory distress.     Breath sounds: Normal breath sounds.  Abdominal:     General: There is no distension.     Palpations: Abdomen is soft.     Tenderness: There is abdominal tenderness.     Comments: Periumbilical epigastric tenderness without rebound or guarding.  No CVA tenderness.  Musculoskeletal:        General: No tenderness.     Cervical back: Neck supple.  Skin:    General: Skin is warm and dry.  Neurological:     Mental Status: She is alert.  Psychiatric:        Behavior: Behavior normal.        Thought Content: Thought content normal.     ED Results / Procedures / Treatments   Labs (all labs ordered are listed, but only abnormal results are displayed) Labs Reviewed  COMPREHENSIVE METABOLIC PANEL - Abnormal; Notable for the following components:      Result Value   Potassium 3.3 (*)    AST 14 (*)    All other components within normal limits  URINALYSIS, ROUTINE W REFLEX MICROSCOPIC - Abnormal; Notable for the following components:   Specific Gravity, Urine >1.030 (*)    All other components within normal limits  CBC WITH DIFFERENTIAL/PLATELET - Abnormal; Notable for the following components:   Hemoglobin 11.6 (*)    HCT 35.9 (*)    MCV 73.1 (*)    MCH 23.6 (*)    All other components within normal limits  PREGNANCY, URINE  LIPASE, BLOOD    EKG None  Radiology No results found.   CT ABDOMEN PELVIS W CONTRAST  Result Date: 04/07/2020 CLINICAL DATA:   Acute nonlocalized abdominal pain, periumbilical EXAM: CT ABDOMEN AND PELVIS WITH CONTRAST TECHNIQUE: Multidetector CT imaging of the abdomen and pelvis was performed using  the standard protocol following bolus administration of intravenous contrast. CONTRAST:  OMNIPAQUE IOHEXOL 300 MG/ML  SOLN COMPARISON:  None. FINDINGS: Lower chest:  No contributory findings. Hepatobiliary: Vague area of low-density along an accessory fissure/lobulation in the posterior right liver measuring roughly 15 mm. No delayed phase available. The density is very similar to a perfusion anomaly along the lower falciform ligament.No evidence of biliary obstruction or stone. Pancreas: Unremarkable. Spleen: Unremarkable. Adrenals/Urinary Tract: Negative adrenals. No hydronephrosis or stone. Unremarkable bladder. Stomach/Bowel: No obstruction. The appendix is low in the pelvis and negative for appendicitis. Moderate formed stool. Vascular/Lymphatic: No acute vascular abnormality. No mass or adenopathy. Reproductive:No pathologic findings. Other: No ascites or pneumoperitoneum. Mild ventral abdominal wall bulging without true hernia. Musculoskeletal: No acute abnormalities. IMPRESSION: 1. No appendicitis or other acute finding. 2. Vague low-density in the right liver, favor perfusion anomaly or other benign process. Consider right upper quadrant ultrasound in 6 months. Electronically Signed   By: Marnee Spring M.D.   On: 04/07/2020 10:03    Procedures Procedures (including critical care time)  Medications Ordered in ED Medications  famotidine (PEPCID) IVPB 20 mg premix (0 mg Intravenous Stopped 04/07/20 0921)  morphine 4 MG/ML injection 4 mg (4 mg Intravenous Given 04/07/20 0837)  ondansetron (ZOFRAN) injection 4 mg (4 mg Intravenous Given 04/07/20 0932)  iohexol (OMNIPAQUE) 300 MG/ML solution 100 mL (100 mLs Intravenous Contrast Given 04/07/20 0939)  promethazine (PHENERGAN) injection 12.5 mg (12.5 mg Intravenous Given 04/07/20  1128)    ED Course  I have reviewed the triage vital signs and the nursing notes.  Pertinent labs & imaging results that were available during my care of the patient were reviewed by me and considered in my medical decision making (see chart for details).    MDM Rules/Calculators/A&P                          34 year old female with abdominal pain.  Unclear etiology, but does not appear to be emergent process.  Plan continue symptomatic treatment.  Return precautions were discussed.  Outpatient follow-up otherwise.  Final Clinical Impression(s) / ED Diagnoses Final diagnoses:  Abdominal pain, unspecified abdominal location  Nausea    Rx / DC Orders ED Discharge Orders         Ordered    ondansetron (ZOFRAN) 4 MG tablet  Every 6 hours     Discontinue  Reprint     04/07/20 1124           Raeford Razor, MD 04/11/20 1242

## 2020-10-22 ENCOUNTER — Other Ambulatory Visit: Payer: Self-pay | Admitting: Advanced Practice Midwife

## 2021-01-29 IMAGING — CT CT ABD-PELV W/ CM
2 of 4 series · 16 of 46 positions shown, 18 images · IV contrast (omnipaque)
Comparison: None.

CLINICAL DATA: Acute nonlocalized abdominal pain, periumbilical

EXAM:
CT ABDOMEN AND PELVIS WITH CONTRAST
TECHNIQUE: Multidetector CT imaging of the abdomen and pelvis was performed
using the standard protocol following bolus administration of
intravenous contrast.
CONTRAST:  100mL OMNIPAQUE IOHEXOL 300 MG/ML  SOLN

[Series 2: axial st · axial · 0.77mm/px · z∈[-548,-178]mm · 13 of 82 slices shown, 15 images]
[im 4/82  soft-tissue]
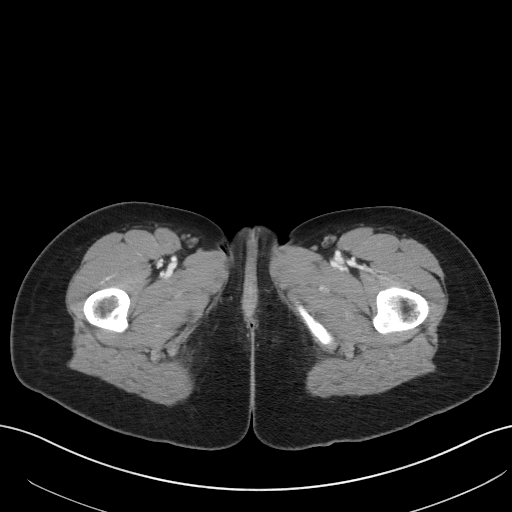
[im 4/82  bone]
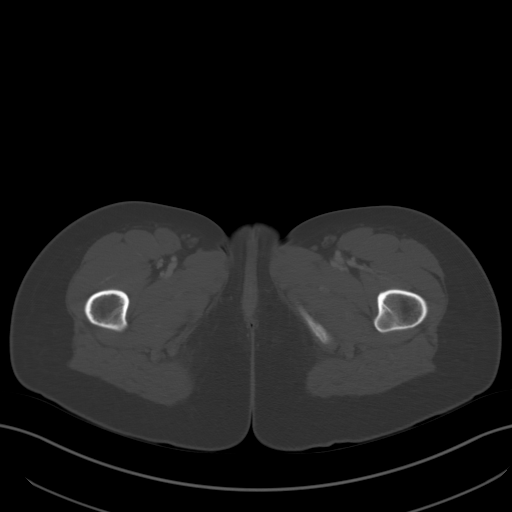
[im 11/82  soft-tissue]
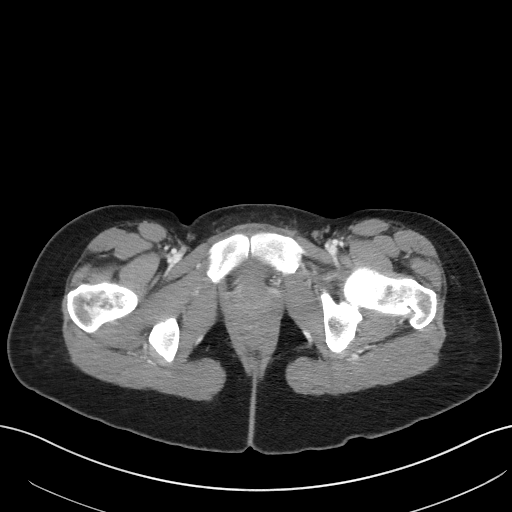
[im 17/82  soft-tissue]
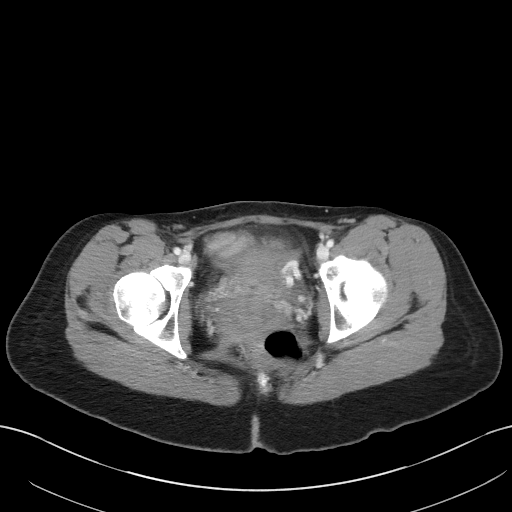
[im 24/82  soft-tissue]
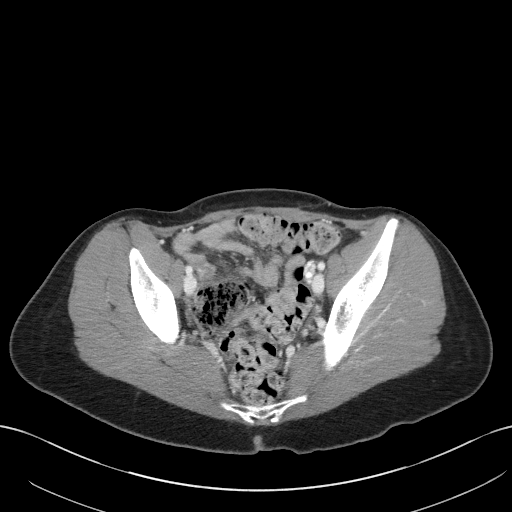
[im 28/82  soft-tissue]
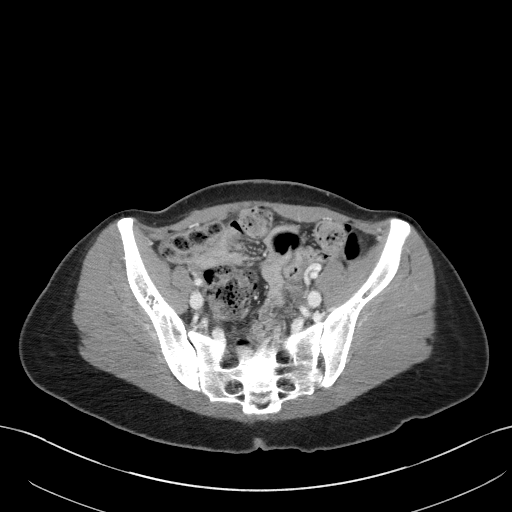
[im 34/82  soft-tissue]
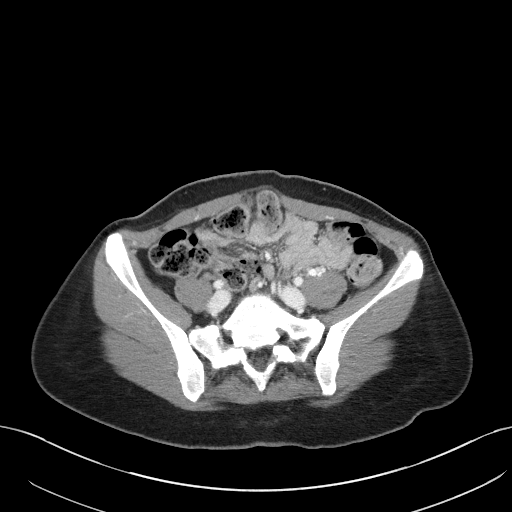
[im 41/82  soft-tissue]
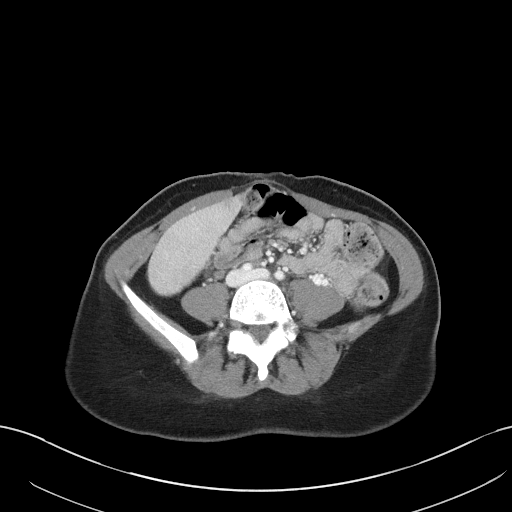
[im 48/82  soft-tissue]
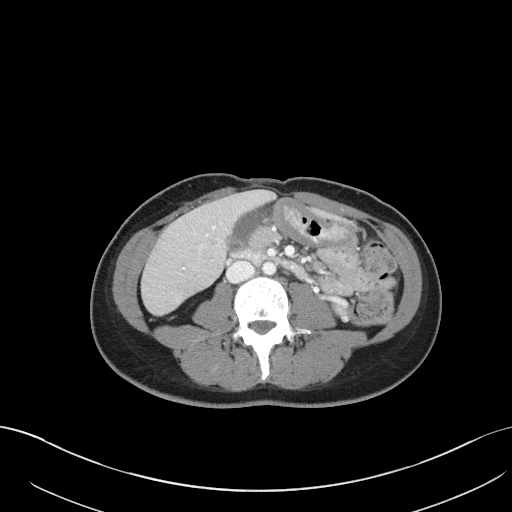
[im 55/82  soft-tissue]
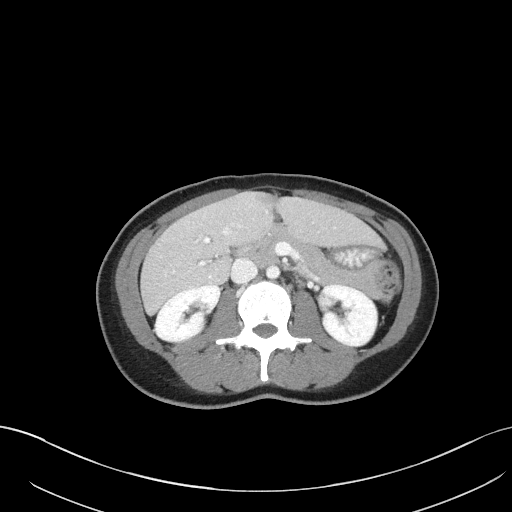
[im 55/82  bone]
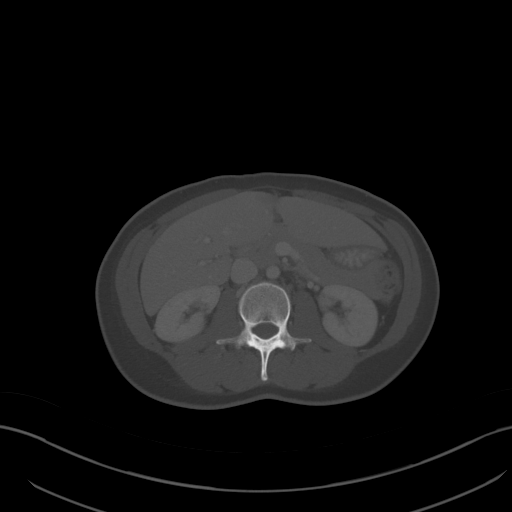
[im 58/82  soft-tissue]
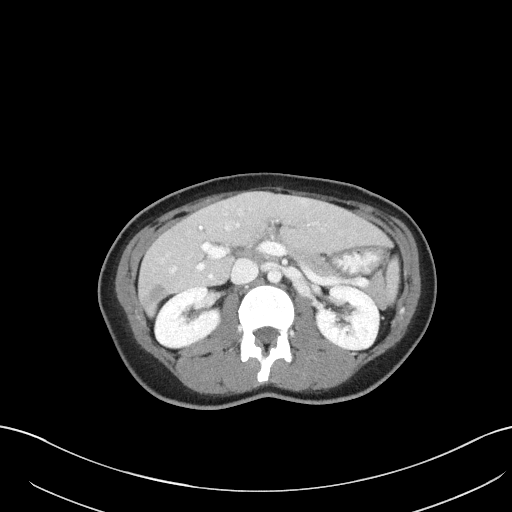
[im 65/82  soft-tissue]
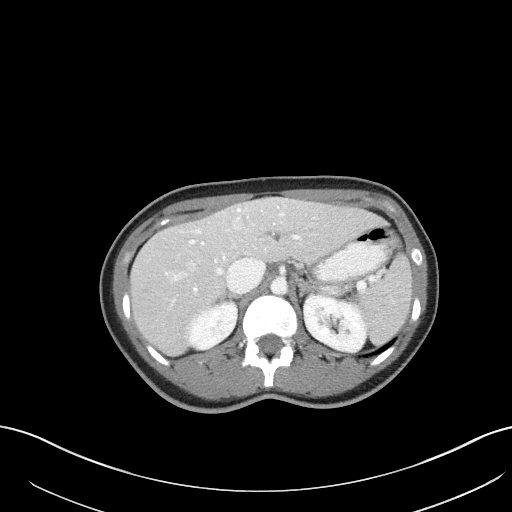
[im 71/82  soft-tissue]
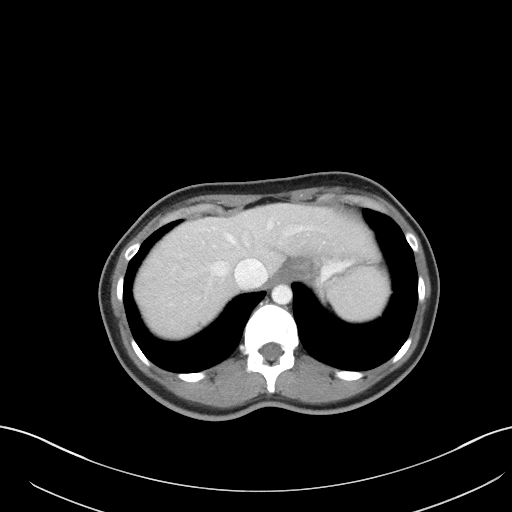
[im 78/82  soft-tissue]
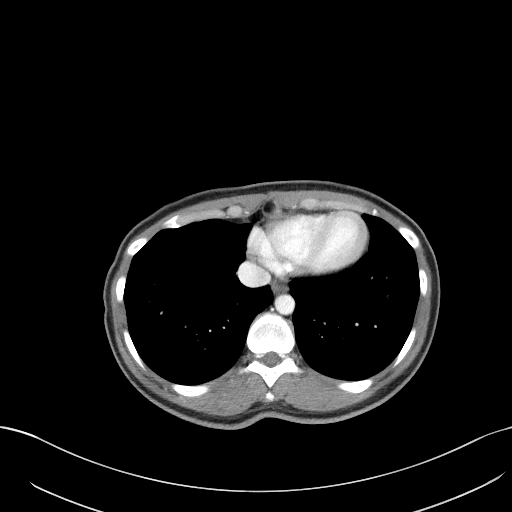

[Series 5: coronal st · coronal · 0.64mm/px · 3 of 72 slices shown]
[im 24/72  soft-tissue]
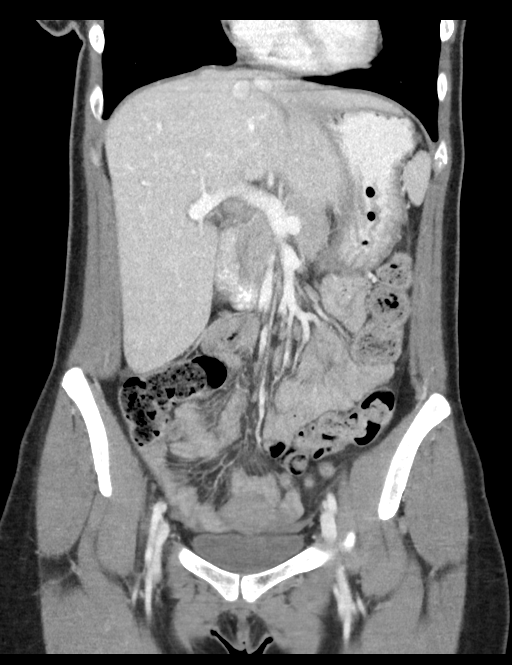
[im 32/72  soft-tissue]
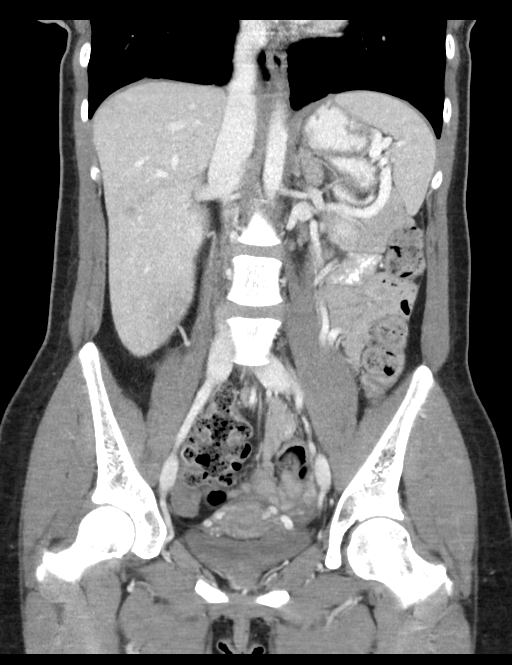
[im 40/72  soft-tissue]
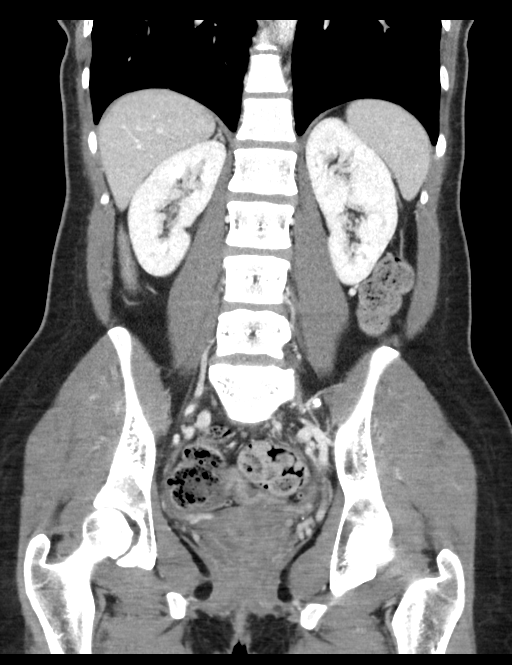

[16 of 46 positions shown; findings below may reference images not displayed]

FINDINGS: Lower chest:  No contributory findings.

Hepatobiliary: Vague area of low-density along an accessory
fissure/lobulation in the posterior right liver measuring roughly 15
mm. No delayed phase available. The density is very similar to a
perfusion anomaly along the lower falciform ligament.No evidence of
biliary obstruction or stone.

Pancreas: Unremarkable.

Spleen: Unremarkable.

Adrenals/Urinary Tract: Negative adrenals. No hydronephrosis or
stone. Unremarkable bladder.

Stomach/Bowel: No obstruction. The appendix is low in the pelvis and
negative for appendicitis. Moderate formed stool.

Vascular/Lymphatic: No acute vascular abnormality. No mass or
adenopathy.

Reproductive:No pathologic findings.

Other: No ascites or pneumoperitoneum. Mild ventral abdominal wall
bulging without true hernia.

Musculoskeletal: No acute abnormalities.
IMPRESSION: 1. No appendicitis or other acute finding.
2. Vague low-density in the right liver, favor perfusion anomaly or
other benign process. Consider right upper quadrant ultrasound in 6
months.

## 2021-05-18 ENCOUNTER — Other Ambulatory Visit: Payer: Self-pay | Admitting: Advanced Practice Midwife

## 2022-02-02 ENCOUNTER — Other Ambulatory Visit: Payer: Self-pay | Admitting: Advanced Practice Midwife
